# Patient Record
Sex: Female | Born: 2000
Health system: Southern US, Community
[De-identification: ages and names within clinical notes are randomized; demographics above are authoritative.]

## PROBLEM LIST (undated history)

## (undated) DIAGNOSIS — E079 Disorder of thyroid, unspecified: Secondary | ICD-10-CM

## (undated) DIAGNOSIS — K219 Gastro-esophageal reflux disease without esophagitis: Secondary | ICD-10-CM

## (undated) HISTORY — DX: Disorder of thyroid, unspecified: E07.9

## (undated) HISTORY — DX: Gastro-esophageal reflux disease without esophagitis: K21.9

## (undated) HISTORY — PX: TONSILLECTOMY: SUR1361

---
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
## 2018-02-25 ENCOUNTER — Other Ambulatory Visit: Payer: Self-pay | Admitting: Unknown Physician Specialty

## 2018-02-25 DIAGNOSIS — H7492 Unspecified disorder of left middle ear and mastoid: Secondary | ICD-10-CM

## 2018-03-05 ENCOUNTER — Ambulatory Visit
Admission: RE | Admit: 2018-03-05 | Discharge: 2018-03-05 | Disposition: A | Discharge status: Home / Self Care | Payer: Managed Care, Other (non HMO) | Source: Ambulatory Visit | Attending: Unknown Physician Specialty | Admitting: Unknown Physician Specialty

## 2018-03-05 DIAGNOSIS — H7492 Unspecified disorder of left middle ear and mastoid: Secondary | ICD-10-CM | POA: Insufficient documentation

## 2018-11-11 ENCOUNTER — Other Ambulatory Visit: Payer: Self-pay | Admitting: Sports Medicine

## 2018-11-11 DIAGNOSIS — M79604 Pain in right leg: Secondary | ICD-10-CM

## 2018-11-29 ENCOUNTER — Ambulatory Visit
Admission: RE | Admit: 2018-11-29 | Discharge: 2018-11-29 | Disposition: A | Discharge status: Home / Self Care | Payer: Managed Care, Other (non HMO) | Source: Ambulatory Visit | Attending: Sports Medicine | Admitting: Sports Medicine

## 2018-11-29 ENCOUNTER — Other Ambulatory Visit: Payer: Self-pay

## 2018-11-29 DIAGNOSIS — M79604 Pain in right leg: Secondary | ICD-10-CM | POA: Insufficient documentation

## 2019-05-19 ENCOUNTER — Other Ambulatory Visit: Payer: Self-pay | Admitting: Physician Assistant

## 2019-05-19 ENCOUNTER — Other Ambulatory Visit: Payer: Self-pay

## 2019-05-19 ENCOUNTER — Other Ambulatory Visit (HOSPITAL_COMMUNITY): Payer: Self-pay | Admitting: Physician Assistant

## 2019-05-19 ENCOUNTER — Ambulatory Visit
Admission: RE | Admit: 2019-05-19 | Discharge: 2019-05-19 | Disposition: A | Discharge status: Home / Self Care | Payer: Managed Care, Other (non HMO) | Source: Ambulatory Visit | Attending: Physician Assistant | Admitting: Physician Assistant

## 2019-05-19 ENCOUNTER — Encounter (INDEPENDENT_AMBULATORY_CARE_PROVIDER_SITE_OTHER): Payer: Self-pay

## 2019-05-19 DIAGNOSIS — R1031 Right lower quadrant pain: Secondary | ICD-10-CM | POA: Insufficient documentation

## 2019-05-19 MED ORDER — IOHEXOL 300 MG/ML  SOLN
100.0000 mL | Freq: Once | INTRAMUSCULAR | Status: AC | PRN
  Administered 2019-05-19: 100 mL via INTRAVENOUS

## 2019-05-22 ENCOUNTER — Other Ambulatory Visit: Payer: Self-pay

## 2019-05-22 ENCOUNTER — Other Ambulatory Visit: Payer: Self-pay | Admitting: Certified Nurse Midwife

## 2019-05-22 ENCOUNTER — Ambulatory Visit
Admission: RE | Admit: 2019-05-22 | Discharge: 2019-05-22 | Disposition: A | Discharge status: Home / Self Care | Payer: Managed Care, Other (non HMO) | Source: Ambulatory Visit | Attending: Certified Nurse Midwife | Admitting: Certified Nurse Midwife

## 2019-05-22 ENCOUNTER — Other Ambulatory Visit (HOSPITAL_COMMUNITY): Payer: Self-pay | Admitting: Certified Nurse Midwife

## 2019-05-22 DIAGNOSIS — R1031 Right lower quadrant pain: Secondary | ICD-10-CM

## 2020-07-08 ENCOUNTER — Ambulatory Visit: Payer: Self-pay

## 2020-07-23 ENCOUNTER — Encounter: Payer: Self-pay | Admitting: Emergency Medicine

## 2020-07-23 ENCOUNTER — Ambulatory Visit
Admission: EM | Admit: 2020-07-23 | Discharge: 2020-07-23 | Disposition: A | Discharge status: Home / Self Care | Payer: Managed Care, Other (non HMO) | Attending: Emergency Medicine | Admitting: Emergency Medicine

## 2020-07-23 ENCOUNTER — Other Ambulatory Visit: Payer: Self-pay

## 2020-07-23 DIAGNOSIS — R300 Dysuria: Secondary | ICD-10-CM | POA: Diagnosis present

## 2020-07-23 DIAGNOSIS — R350 Frequency of micturition: Secondary | ICD-10-CM | POA: Diagnosis present

## 2020-07-23 LAB — POCT URINALYSIS DIP (MANUAL ENTRY)
Bilirubin, UA: NEGATIVE
Blood, UA: NEGATIVE
Glucose, UA: NEGATIVE mg/dL
Ketones, POC UA: NEGATIVE mg/dL
Leukocytes, UA: NEGATIVE
Nitrite, UA: NEGATIVE
Protein Ur, POC: NEGATIVE mg/dL
Spec Grav, UA: 1.015 (ref 1.010–1.025)
Urobilinogen, UA: 1 E.U./dL
pH, UA: 7 (ref 5.0–8.0)

## 2020-07-23 LAB — POCT URINE PREGNANCY: Preg Test, Ur: NEGATIVE

## 2020-07-23 MED ORDER — PHENAZOPYRIDINE HCL 200 MG PO TABS: 200.0000 mg | ORAL_TABLET | Freq: Three times a day (TID) | ORAL | 0 refills | Status: DC

## 2020-07-23 NOTE — ED Provider Notes (Signed)
[facility]   CC: Burning with urination  SUBJECTIVE:  Charlene Hensley is a 20 y.o. female who complains of urinary frequency, and pain x 1 day.  Patient denies a precipitating event, recent sexual encounter, excessive caffeine intake.  Denies abdominal or flank pain.  Has NOT tried OTC medications.  Symptoms are made worse with urination.  Admits to similar symptoms in the past.  Denies fever, chills, nausea, vomiting, abdominal pain, flank pain, abnormal vaginal discharge or bleeding, hematuria.    LMP: No LMP recorded.  ROS: As in HPI.  All other pertinent ROS negative.     History reviewed. No pertinent past medical history. History reviewed. No pertinent surgical history. No Known Allergies No current facility-administered medications on file prior to encounter.   No current outpatient medications on file prior to encounter.   Social History   Socioeconomic History  . Marital status: Single    Spouse name: Not on file  . Number of children: Not on file  . Years of education: Not on file  . Highest education level: Not on file  Occupational History  . Not on file  Tobacco Use  . Smoking status: Never Smoker  . Smokeless tobacco: Never Used  Substance and Sexual Activity  . Alcohol use: Not on file  . Drug use: Not on file  . Sexual activity: Not on file  Other Topics Concern  . Not on file  Social History Narrative  . Not on file   Social Determinants of Health   Financial Resource Strain: Not on file  Food Insecurity: Not on file  Transportation Needs: Not on file  Physical Activity: Not on file  Stress: Not on file  Social Connections: Not on file  Intimate Partner Violence: Not on file   No family history on file.  OBJECTIVE:  Vitals:   07/23/20 1222  BP: 118/79  Pulse: 92  Resp: 16  Temp: 98.2 F (36.8 C)  TempSrc: Oral  SpO2: 97%   General appearance: AOx3 in no acute distress HEENT: NCAT.  Oropharynx clear.  Lungs: clear to  auscultation bilaterally without adventitious breath sounds Heart: regular rate and rhythm.   Abdomen: soft; non-distended; no tenderness; bowel sounds present; no guarding Back: no CVA tenderness Extremities: no edema; symmetrical with no gross deformities Skin: warm and dry Neurologic: Ambulates from chair to exam table without difficulty Psychological: alert and cooperative; normal mood and affect  Labs Reviewed  URINE CULTURE  POCT URINALYSIS DIP (MANUAL ENTRY)  POCT URINE PREGNANCY    ASSESSMENT & PLAN:  1. Urinary frequency   2. Dysuria     Meds ordered this encounter  Medications  . phenazopyridine (PYRIDIUM) 200 MG tablet    Sig: Take 1 tablet (200 mg total) by mouth 3 (three) times daily.    Dispense:  6 tablet    Refill:  0    Order Specific Question:   Supervising Provider    Answer:   Eustace Moore [[id number]]    Urine culture sent.  We will call you with abnormal results.   Push fluids and get plenty of rest.   Take pyridium as prescribed and as needed for symptomatic relief Follow up with PCP if symptoms persists Return here or go to ER if you have any new or worsening symptoms such as fever, worsening abdominal pain, nausea/vomiting, flank pain, etc...  Outlined signs and symptoms indicating need for more acute intervention. Patient verbalized understanding. After Visit Summary given.     Ludella Pranger,  [place], PA-C 07/23/20 1315

## 2020-07-23 NOTE — ED Triage Notes (Signed)
urinary frequency and pain since yesterday

## 2020-07-23 NOTE — Discharge Instructions (Signed)
Urine culture sent.  We will call you with abnormal results.   Push fluids and get plenty of rest.   Take pyridium as prescribed and as needed for symptomatic relief Follow up with PCP if symptoms persists Return here or go to ER if you have any new or worsening symptoms such as fever, worsening abdominal pain, nausea/vomiting, flank pain, etc..Marland Kitchen

## 2020-07-25 LAB — URINE CULTURE
Culture: >=100000 — AB
Special Requests: NORMAL

## 2020-11-30 ENCOUNTER — Other Ambulatory Visit: Payer: Self-pay

## 2020-11-30 ENCOUNTER — Ambulatory Visit
Admission: EM | Admit: 2020-11-30 | Discharge: 2020-11-30 | Disposition: A | Discharge status: Home / Self Care | Payer: Managed Care, Other (non HMO) | Attending: Emergency Medicine | Admitting: Emergency Medicine

## 2020-11-30 ENCOUNTER — Encounter: Payer: Self-pay | Admitting: *Deleted

## 2020-11-30 DIAGNOSIS — Z23 Encounter for immunization: Secondary | ICD-10-CM

## 2020-11-30 MED ORDER — TETANUS-DIPHTH-ACELL PERTUSSIS 5-2.5-18.5 LF-MCG/0.5 IM SUSY
0.5000 mL | PREFILLED_SYRINGE | Freq: Once | INTRAMUSCULAR | Status: AC
  Administered 2020-11-30: 0.5 mL via INTRAMUSCULAR

## 2020-11-30 NOTE — ED Triage Notes (Signed)
Pt needs Tdap for school

## 2021-02-01 ENCOUNTER — Encounter: Payer: Self-pay | Admitting: Emergency Medicine

## 2021-02-01 ENCOUNTER — Other Ambulatory Visit: Payer: Self-pay

## 2021-02-01 ENCOUNTER — Ambulatory Visit
Admission: EM | Admit: 2021-02-01 | Discharge: 2021-02-01 | Disposition: A | Discharge status: Home / Self Care | Payer: Managed Care, Other (non HMO) | Attending: Emergency Medicine | Admitting: Emergency Medicine

## 2021-02-01 DIAGNOSIS — H9203 Otalgia, bilateral: Secondary | ICD-10-CM

## 2021-02-01 DIAGNOSIS — M26623 Arthralgia of bilateral temporomandibular joint: Secondary | ICD-10-CM | POA: Diagnosis not present

## 2021-02-01 MED ORDER — CYCLOBENZAPRINE HCL 10 MG PO TABS: 10.0000 mg | ORAL_TABLET | Freq: Every day | ORAL | 0 refills | Status: DC

## 2021-02-01 MED ORDER — PREDNISONE 20 MG PO TABS: 40.0000 mg | ORAL_TABLET | Freq: Every day | ORAL | 0 refills | Status: AC

## 2021-02-01 MED ORDER — NAPROXEN 500 MG PO TABS: 500.0000 mg | ORAL_TABLET | Freq: Two times a day (BID) | ORAL | 0 refills | Status: DC

## 2021-02-01 NOTE — ED Triage Notes (Signed)
Reoccurring bilateral ear infection has been using cipro drops.  States ears itch and burn and inside canal has been swelling.  Ears are tender on the outside.  States symptoms x a few months.  Has seen and ENT without any answers.

## 2021-02-01 NOTE — ED Provider Notes (Signed)
HPI  SUBJECTIVE:  Charlene Hensley is a 20 y.o. female who presents with bilateral ear itching, burning pain.  She is concerned that she has "another ear infection."  This will be the fourth 1 this month.  She has had ear pain every week over the past month, it has never completely resolved.  No change in her hearing, otorrhea, denies foreign body insertion, recent swelling, fevers, vertigo, nasal congestion, rhinorrhea, URI or allergy symptoms.  She saw ENT in July 22, was found to have otitis externa and was sent home with Ciprodex.  She has been on the Ciprodex for the past week with worsening of her pain.  She was evaluated at another urgent care 1 week ago and just finished a course of amoxicillin without any improvement.  She denies grinding her teeth at night.  She has a past medical history of hypothyroidism.  No history of diabetes.  LMP: Last week.  Denies the possibility of being pregnant.  JYN:WGNFAO, Hardin Negus, MD ENT: Dr. Jenne Campus    History reviewed. No pertinent past medical history.  History reviewed. No pertinent surgical history.  History reviewed. No pertinent family history.  Social History   Tobacco Use   Smoking status: Never   Smokeless tobacco: Never    No current facility-administered medications for this encounter.  Current Outpatient Medications:    cyclobenzaprine (FLEXERIL) 10 MG tablet, Take 1 tablet (10 mg total) by mouth at bedtime., Disp: 20 tablet, Rfl: 0   naproxen (NAPROSYN) 500 MG tablet, Take 1 tablet (500 mg total) by mouth 2 (two) times daily., Disp: 20 tablet, Rfl: 0   predniSONE (DELTASONE) 20 MG tablet, Take 2 tablets (40 mg total) by mouth daily with breakfast for 5 days., Disp: 10 tablet, Rfl: 0  No Known Allergies   ROS  As noted in HPI.   Physical Exam  BP 122/88 (BP Location: Right Arm)   Pulse 87   Temp 98.2 F (36.8 C) (Oral)   Resp 18   LMP 01/27/2021 (Exact Date)   SpO2 98%   Constitutional: Well developed, well nourished, no  acute distress Eyes:  EOMI, conjunctiva normal bilaterally HENT: Normocephalic, atraumatic,mucus membranes moist.  Bilateral external ears, EACs, TMs normal.  Hearing grossly intact and equal bilaterally.  Bilateral pain with traction on pinna, palpation of tragus.  Mastoid nontender, normal appearance bilaterally.  Bilateral TMJ tenderness.  No crepitus. Neck: No cervical adenopathy Respiratory: Normal inspiratory effort Cardiovascular: Normal rate GI: nondistended skin: No rash, skin intact Musculoskeletal: no deformities Neurologic: Alert & oriented x 3, no focal neuro deficits Psychiatric: Speech and behavior appropriate   ED Course   Medications - No data to display  No orders of the defined types were placed in this encounter.   No results found for this or any previous visit (from the past 24 hour(s)). No results found.  ED Clinical Impression  1. Bilateral temporomandibular joint pain   2. Otalgia of both ears      ED Assessment/Plan  Suspect TMJ arthralgia causing the ear pain.  Ciprodex is making her symptoms worse.  There is no evidence of an otitis externa or media.  Will send home with Naprosyn, prednisone 40 mg for 5 days, Flexeril at night, soft diet, discontinue Ciprodex.  Follow-up with Dr. Jenne Campus, her ENT, or her dentist if this does not help.  Discussed MDM, treatment plan, and plan for follow-up with patient. patient agrees with plan.   Meds ordered this encounter  Medications   naproxen (NAPROSYN)  500 MG tablet    Sig: Take 1 tablet (500 mg total) by mouth 2 (two) times daily.    Dispense:  20 tablet    Refill:  0   cyclobenzaprine (FLEXERIL) 10 MG tablet    Sig: Take 1 tablet (10 mg total) by mouth at bedtime.    Dispense:  20 tablet    Refill:  0   predniSONE (DELTASONE) 20 MG tablet    Sig: Take 2 tablets (40 mg total) by mouth daily with breakfast for 5 days.    Dispense:  10 tablet    Refill:  0      *This clinic note was created using  Scientist, clinical (histocompatibility and immunogenetics). Therefore, there may be occasional mistakes despite careful proofreading.  ?    Domenick Gong, MD 02/02/21 [phone]

## 2021-02-01 NOTE — Discharge Instructions (Addendum)
Soft diet, Flexeril at night to help prevent you from grinding your teeth.  Try Naprosyn with 1000 mg of Tylenol twice a day.  Prednisone will also help with inflammation.  You can follow-up with your dentist or with Dr. Jenne Campus if this does not help.

## 2021-03-29 IMAGING — CT CT ABD-PELV W/ CM
1 of 2 series · 15 of 32 positions shown, 19 images · IV contrast (APPLIED)
Comparison: None.

CLINICAL DATA: Acute right lower quadrant abdominal pain.

EXAM:
CT ABDOMEN AND PELVIS WITH CONTRAST
TECHNIQUE: Multidetector CT imaging of the abdomen and pelvis was performed
using the standard protocol following bolus administration of
intravenous contrast.
CONTRAST:  100mL OMNIPAQUE IOHEXOL 300 MG/ML  SOLN

[Series 2: axial st · axial · 0.66mm/px · z∈[-1136,-726]mm · 15 of 90 slices shown, 19 images]
[im 4/90  soft-tissue]
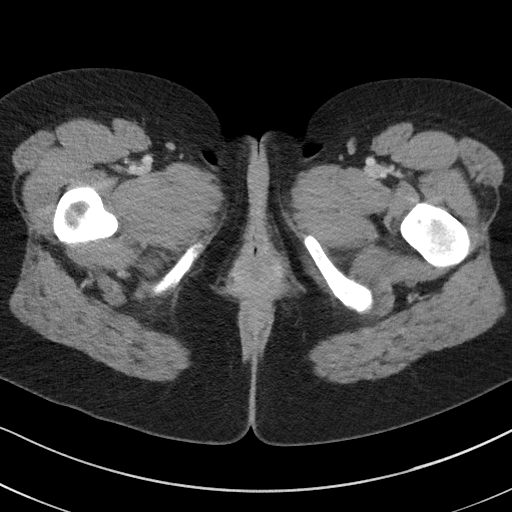
[im 4/90  bone]
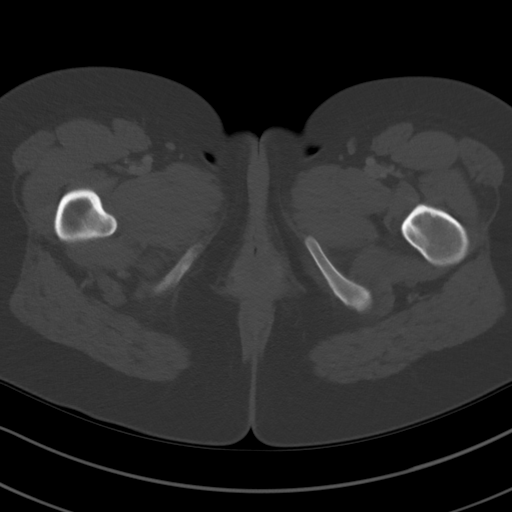
[im 11/90  soft-tissue]
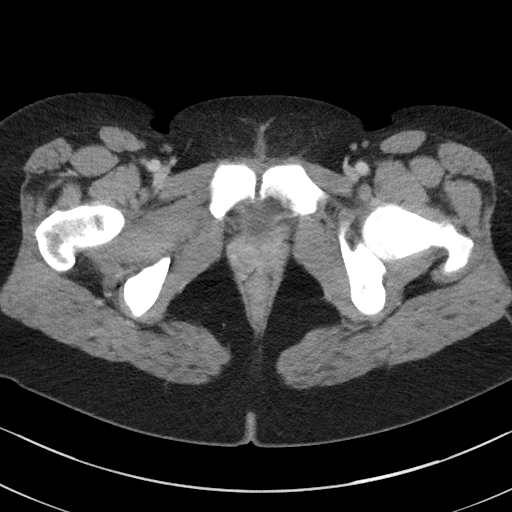
[im 18/90  soft-tissue]
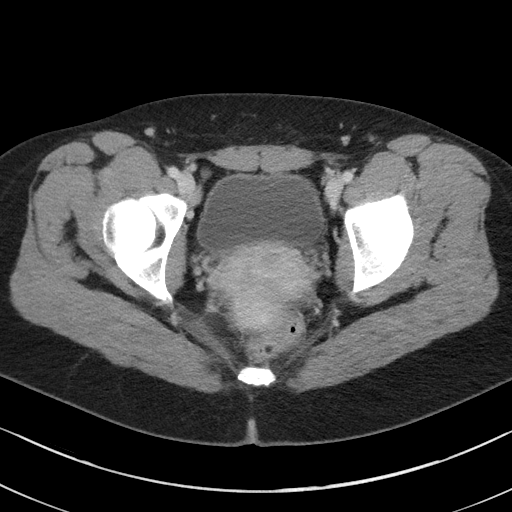
[im 25/90  soft-tissue]
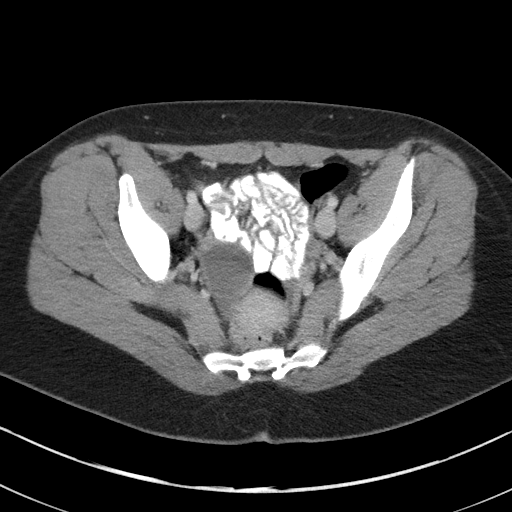
[im 33/90  soft-tissue]
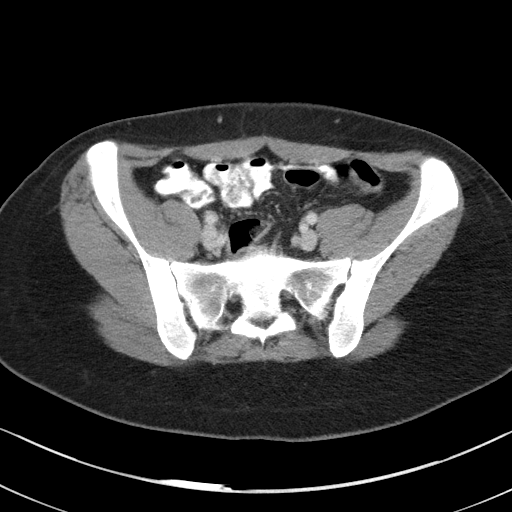
[im 40/90  soft-tissue]
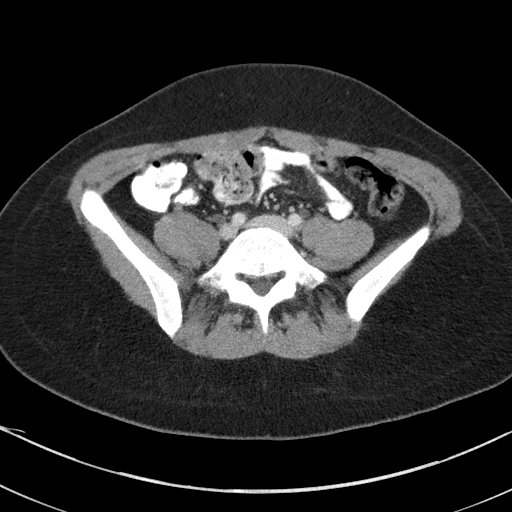
[im 47/90  soft-tissue]
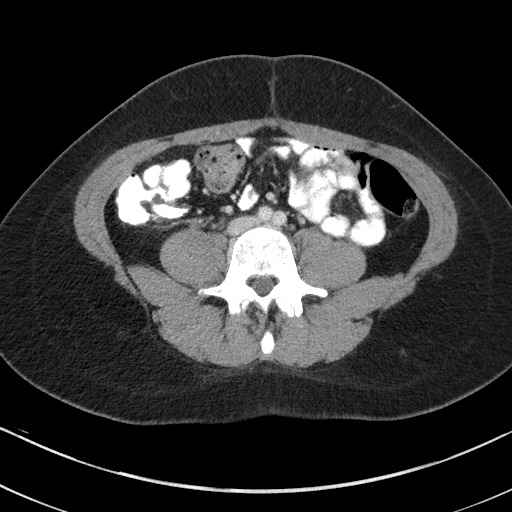
[im 50/90  soft-tissue]
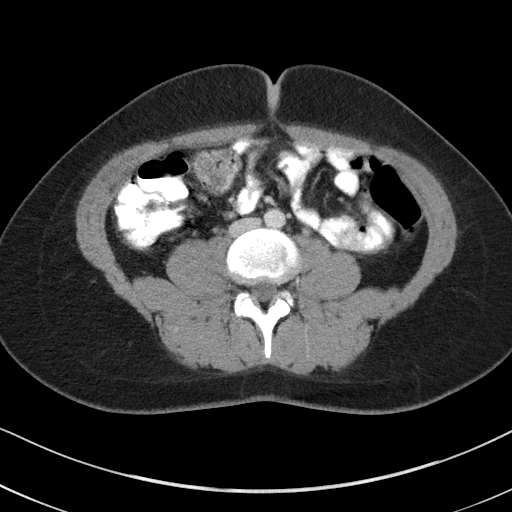
[im 57/90  soft-tissue]
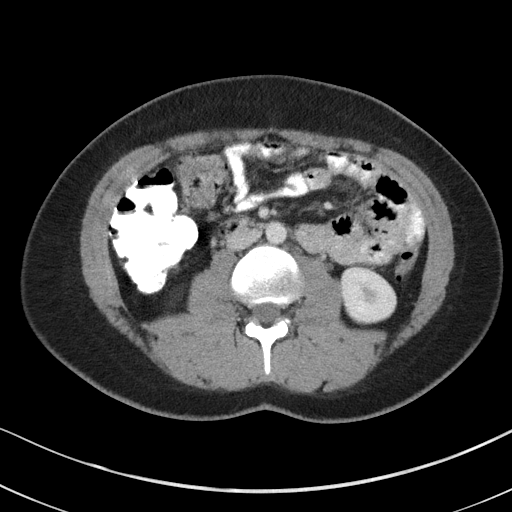
[im 57/90  bone]
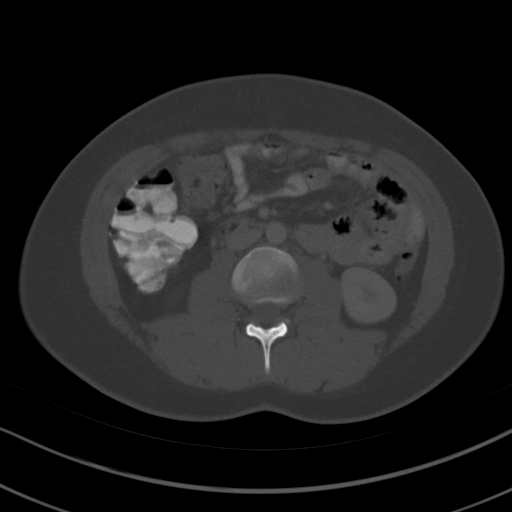
[im 65/90  soft-tissue]
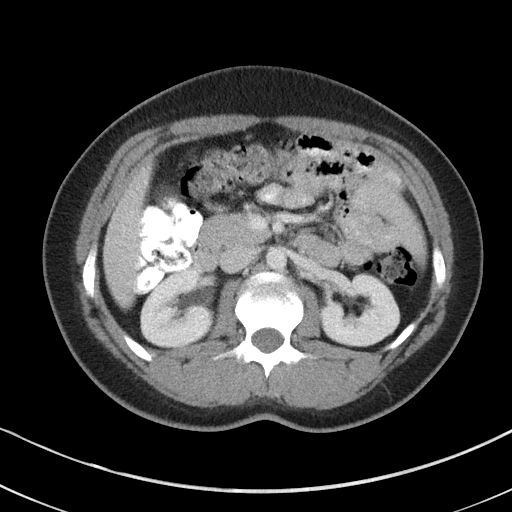
[im 72/90  soft-tissue]
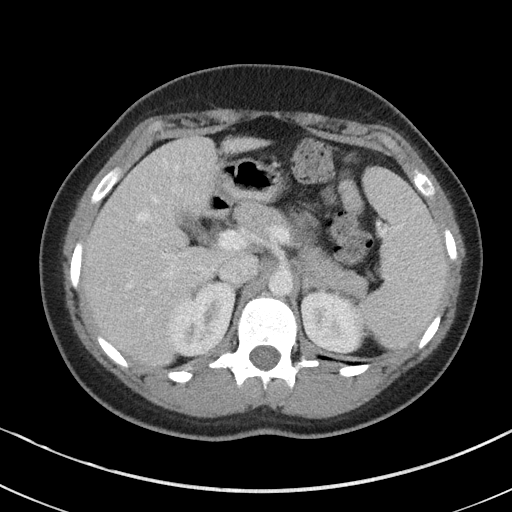
[im 75/90  lung]
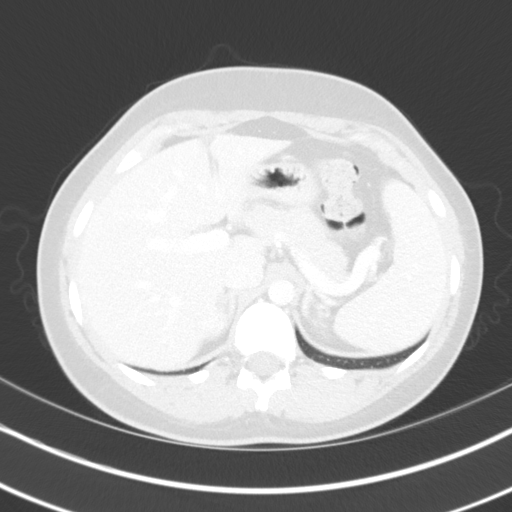
[im 79/90  soft-tissue]
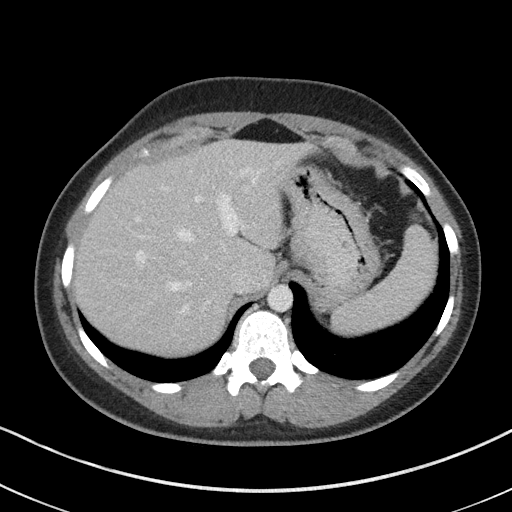
[im 79/90  lung]
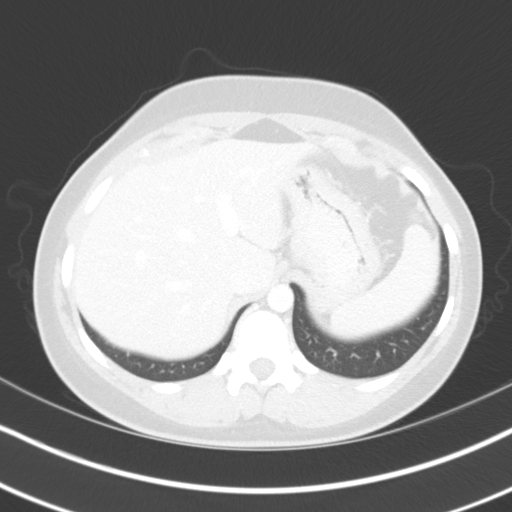
[im 82/90  lung]
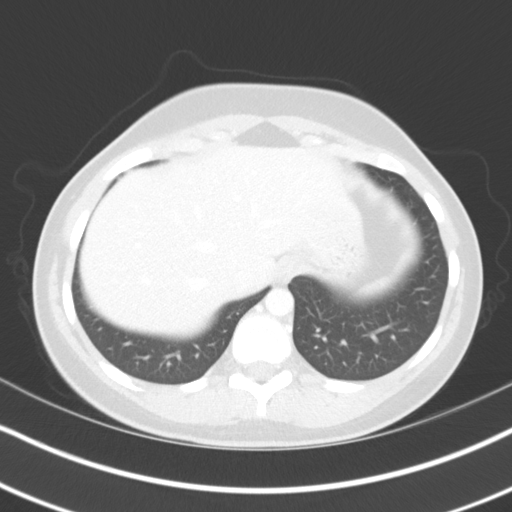
[im 86/90  soft-tissue]
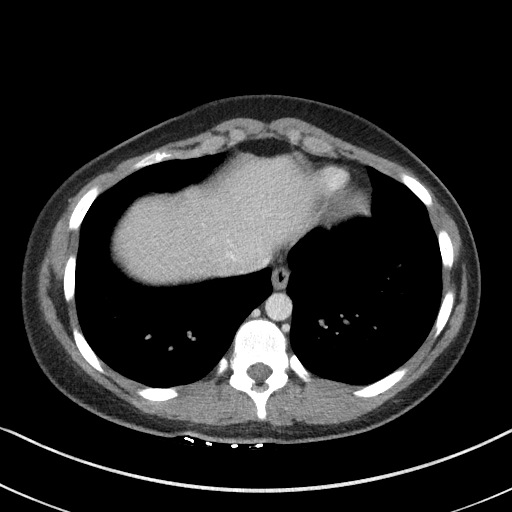
[im 86/90  lung]
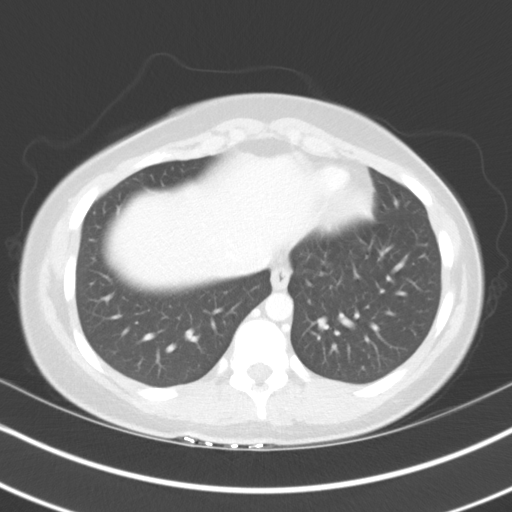

[15 of 32 positions shown; findings below may reference images not displayed]

FINDINGS: Lower chest: No acute abnormality.

Hepatobiliary: No focal liver abnormality is seen. No gallstones,
gallbladder wall thickening, or biliary dilatation.

Pancreas: Unremarkable. No pancreatic ductal dilatation or
surrounding inflammatory changes.

Spleen: Normal in size without focal abnormality.

Adrenals/Urinary Tract: Adrenal glands are unremarkable. Kidneys are
normal, without renal calculi, focal lesion, or hydronephrosis.
Bladder is unremarkable.

Stomach/Bowel: Stomach is within normal limits. Appendix appears
normal. No evidence of bowel wall thickening, distention, or
inflammatory changes.

Vascular/Lymphatic: No significant vascular findings are present. No
enlarged abdominal or pelvic lymph nodes.

Reproductive: 3.5 cm right ovarian cyst is noted. Uterus and left
adnexa are unremarkable.

Other: No abdominal wall hernia or abnormality. No abdominopelvic
ascites.

Musculoskeletal: No acute or significant osseous findings.
IMPRESSION: 3.5 cm right ovarian cyst. No other abnormality seen in the abdomen
or pelvis.

## 2021-10-25 ENCOUNTER — Other Ambulatory Visit (HOSPITAL_COMMUNITY)
Admission: RE | Admit: 2021-10-25 | Discharge: 2021-10-25 | Disposition: A | Discharge status: Home / Self Care | Payer: Managed Care, Other (non HMO) | Source: Ambulatory Visit | Attending: Adult Health | Admitting: Adult Health

## 2021-10-25 ENCOUNTER — Encounter: Payer: Self-pay | Admitting: Adult Health

## 2021-10-25 ENCOUNTER — Ambulatory Visit (INDEPENDENT_AMBULATORY_CARE_PROVIDER_SITE_OTHER): Payer: Managed Care, Other (non HMO) | Admitting: Adult Health

## 2021-10-25 VITALS — BP 123/77 | HR 86 | Ht 65.5 in | Wt 181.0 lb

## 2021-10-25 DIAGNOSIS — Z01419 Encounter for gynecological examination (general) (routine) without abnormal findings: Secondary | ICD-10-CM | POA: Diagnosis present

## 2021-10-25 DIAGNOSIS — Z30011 Encounter for initial prescription of contraceptive pills: Secondary | ICD-10-CM | POA: Insufficient documentation

## 2021-10-25 MED ORDER — NORETHIN ACE-ETH ESTRAD-FE 1-20 MG-MCG PO TABS: 1.0000 | ORAL_TABLET | Freq: Every day | ORAL | 3 refills | Status: DC

## 2021-10-25 NOTE — Progress Notes (Signed)
Patient ID: Charlene Hensley, female   DOB: 2000-06-10, 21 y.o.   MRN: [id number] History of Present Illness: Charlene Hensley is a 21 year old white female, engaged, G0P0, in for a well woman gyn exam and first pap. She is a new pt. She works at [employer]. PCP is Dr Hyacinth Meeker, at [facility].   Current Medications, Allergies, Past Medical History, Past Surgical History, Family History and Social History were reviewed in [name] record.     Review of Systems: Patient denies any headaches, hearing loss, fatigue, blurred vision, shortness of breath, chest pain, abdominal pain, problems with bowel movements, urination, or intercourse.(Has never had sex). No joint pain or Hensley swings.  Stress at work   Physical Exam:BP 123/77 (BP Location: Left Arm, Patient Position: Sitting, Cuff Size: Normal)   Pulse 86   Ht 5' 5.5" (1.664 m)   Wt 181 lb (82.1 kg)   LMP 10/23/2021   BMI 29.66 kg/m   General:  Well developed, well nourished, no acute distress Skin:  Warm and dry Neck:  Midline trachea, normal thyroid, good ROM, no lymphadenopathy Lungs; Clear to auscultation bilaterally Breast:  No dominant palpable mass, retraction, or nipple discharge Cardiovascular: Regular rate and rhythm Abdomen:  Soft, non tender, no hepatosplenomegaly Pelvic:  External genitalia is normal in appearance, no lesions.  The vagina is normal in appearance. +period blood.Urethra has no lesions or masses. The cervix is nulliparous,pap with GC.CHL performed.  Uterus is felt to be normal size, shape, and contour.  No adnexal masses or tenderness noted.Bladder is non tender, no masses felt. Extremities/musculoskeletal:  No swelling or varicosities noted, no clubbing or cyanosis Psych:  No Hensley changes, alert and cooperative,seems happy AA is 2 Fall risk is low    10/25/2021   10:49 AM  Depression screen PHQ 2/9  Decreased Interest 0  Down, Depressed, Hopeless 2  PHQ - 2 Score 2  Altered sleeping 3  Tired,  decreased energy 3  Change in appetite 0  Feeling bad or failure about yourself  2  Trouble concentrating 3  Moving slowly or fidgety/restless 0  Suicidal thoughts 0  PHQ-9 Score 13       10/25/2021   10:49 AM  GAD 7 : Generalized Anxiety Score  Nervous, Anxious, on Edge 2  Control/stop worrying 3  Worry too much - different things 1  Trouble relaxing 3  Restless 0  Easily annoyed or irritable 0  Afraid - awful might happen 1  Total GAD 7 Score 10      Upstream - 10/25/21 1100       Pregnancy Intention Screening   Does the patient want to become pregnant in the next year? No    Does the patient's partner want to become pregnant in the next year? No    Would the patient like to discuss contraceptive options today? Yes      Contraception Wrap Up   Current Method Abstinence    End Method Oral Contraceptive;Abstinence    Contraception Counseling Provided Yes             Examination chaperoned by Charlene Mood LPN  Impression and Plan: 1. Encounter for gynecological examination with Papanicolaou smear of cervix Pap sent Physical in 1 year Pap in 3 years if normal  2. Encounter for initial prescription of contraceptive pills She denies any MI,strike, DVT, breat cancer or migraine with aura Will start [place] 1/20 today, take same time every day Meds ordered this encounter  Medications  norethindrone-ethinyl estradiol-FE (LOESTRIN FE) 1-20 MG-MCG tablet    Sig: Take 1 tablet by mouth daily.    Dispense:  84 tablet    Refill:  3    Order Specific Question:   Supervising Provider    Answer:   Duane Lope H [[id number]]    Follow up in 3 months for ROS

## 2021-10-26 LAB — CYTOLOGY - PAP
Chlamydia: NEGATIVE
Comment: NEGATIVE
Comment: NORMAL
Diagnosis: NEGATIVE
Neisseria Gonorrhea: NEGATIVE

## 2021-11-24 ENCOUNTER — Encounter: Payer: Self-pay | Admitting: Nurse Practitioner

## 2021-11-24 ENCOUNTER — Ambulatory Visit: Payer: Managed Care, Other (non HMO) | Admitting: Nurse Practitioner

## 2021-11-24 VITALS — BP 110/76 | HR 104 | Ht 67.0 in | Wt 180.4 lb

## 2021-11-24 DIAGNOSIS — G43009 Migraine without aura, not intractable, without status migrainosus: Secondary | ICD-10-CM | POA: Diagnosis not present

## 2021-11-24 DIAGNOSIS — E039 Hypothyroidism, unspecified: Secondary | ICD-10-CM

## 2021-11-24 MED ORDER — SUMATRIPTAN SUCCINATE 50 MG PO TABS: 50.0000 mg | ORAL_TABLET | ORAL | 0 refills | Status: AC | PRN

## 2021-11-24 NOTE — Progress Notes (Signed)
   Subjective:    Patient ID: Charlene Hensley, female    DOB: 04/28/2000, 21 y.o.   MRN: [id number]  HPI  21 year old patient with history of thyroid disease and headaches presents to the clinic to establish care.  Patient states that she was started on Propanolol for frequent headaches which have helped. However patient states that she continues to have headaches 1-2 week. Patient was previously on Topamax, however patient states that that medication was not helpful.   Patient getting married In the fall.   Review of Systems  Neurological:  Positive for headaches.  All other systems reviewed and are negative.      Objective:   Physical Exam Vitals reviewed.  Constitutional:      General: She is not in acute distress.    Appearance: Normal appearance. She is normal weight. She is not ill-appearing, toxic-appearing or diaphoretic.  HENT:     Head: Normocephalic and atraumatic.  Cardiovascular:     Rate and Rhythm: Normal rate and regular rhythm.     Pulses: Normal pulses.     Heart sounds: Normal heart sounds. No murmur heard. Pulmonary:     Effort: Pulmonary effort is normal. No respiratory distress.     Breath sounds: Normal breath sounds. No wheezing.  Musculoskeletal:     Comments: Grossly intact  Skin:    General: Skin is warm.     Capillary Refill: Capillary refill takes less than 2 seconds.  Neurological:     General: No focal deficit present.     Mental Status: She is alert.     Cranial Nerves: No cranial nerve deficit.     Sensory: No sensory deficit.     Motor: No weakness.     Coordination: Coordination normal.     Gait: Gait normal.     Comments: Grossly intact  Psychiatric:        Mood and Affect: Mood normal.        Behavior: Behavior normal.           Assessment & Plan:   1. Hypothyroidism, unspecified type -Patient currently taking levothyroxine - TSH + free T4  2. Migraine without aura and without status migrainosus, not intractable -  Continue taking propanolol  - Will add abortive therapy with triptan. - S/E reviewed in detail.  - SUMAtriptan (IMITREX) 50 MG tablet; Take 1 tablet (50 mg total) by mouth every 2 (two) hours as needed for migraine. May repeat in 2 hours if headache persists or recurs. Do not exceed 200mg  per day.  Dispense: 10 tablet; Refill: 0 - RTC in 3 months or sooner if needed.

## 2021-11-25 LAB — TSH+FREE T4
Free T4: 1.7 ng/dL (ref 0.82–1.77)
TSH: 1.28 u[IU]/mL (ref 0.450–4.500)

## 2021-12-19 ENCOUNTER — Encounter: Payer: Self-pay | Admitting: Nurse Practitioner

## 2021-12-22 ENCOUNTER — Ambulatory Visit: Payer: Managed Care, Other (non HMO) | Admitting: Nurse Practitioner

## 2021-12-29 ENCOUNTER — Encounter: Payer: Self-pay | Admitting: Nurse Practitioner

## 2021-12-29 ENCOUNTER — Ambulatory Visit: Payer: Managed Care, Other (non HMO) | Admitting: Nurse Practitioner

## 2021-12-29 VITALS — BP 98/62 | HR 79 | Temp 98.2°F | Wt 184.0 lb

## 2021-12-29 DIAGNOSIS — F32A Depression, unspecified: Secondary | ICD-10-CM | POA: Diagnosis not present

## 2021-12-29 DIAGNOSIS — E039 Hypothyroidism, unspecified: Secondary | ICD-10-CM

## 2021-12-29 DIAGNOSIS — G2581 Restless legs syndrome: Secondary | ICD-10-CM | POA: Diagnosis not present
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
## 2021-12-29 NOTE — Patient Instructions (Addendum)
[facility]) Phone: [phone] Fax: [phone]  [facility])  Phone: [phone] Fax:  [phone]  [facility] (telehealth only for now) [email or web address] [phone]  [facility] [phone]  [facility] [phone]  [facility] [address], [place], [place] [address]  ~4.7 mi [phone]  [place] [phone]  [facility] [place] [phone]

## 2021-12-30 LAB — IRON,TIBC AND FERRITIN PANEL
Ferritin: 50 ng/mL (ref 15–150)
Iron Saturation: 36 % (ref 15–55)
Iron: 102 ug/dL (ref 27–159)
Total Iron Binding Capacity: 282 ug/dL (ref 250–450)
UIBC: 180 ug/dL (ref 131–425)

## 2021-12-30 LAB — CBC WITH DIFFERENTIAL/PLATELET
Basophils Absolute: 0.1 10*3/uL (ref 0.0–0.2)
Basos: 1 %
EOS (ABSOLUTE): 0.4 10*3/uL (ref 0.0–0.4)
Eos: 5 %
Hematocrit: 40 % (ref 34.0–46.6)
Hemoglobin: 13.8 g/dL (ref 11.1–15.9)
Immature Grans (Abs): 0 10*3/uL (ref 0.0–0.1)
Immature Granulocytes: 0 %
Lymphocytes Absolute: 1.8 10*3/uL (ref 0.7–3.1)
Lymphs: 22 %
MCH: 30.6 pg (ref 26.6–33.0)
MCHC: 34.5 g/dL (ref 31.5–35.7)
MCV: 89 fL (ref 79–97)
Monocytes Absolute: 0.6 10*3/uL (ref 0.1–0.9)
Monocytes: 7 %
Neutrophils Absolute: 5.4 10*3/uL (ref 1.4–7.0)
Neutrophils: 65 %
Platelets: 306 10*3/uL (ref 150–450)
RBC: 4.51 x10E6/uL (ref 3.77–5.28)
RDW: 11.6 % — ABNORMAL LOW (ref 11.7–15.4)
WBC: 8.3 10*3/uL (ref 3.4–10.8)

## 2021-12-30 LAB — CMP14+EGFR
ALT: 14 IU/L (ref 0–32)
AST: 11 IU/L (ref 0–40)
Albumin/Globulin Ratio: 2 (ref 1.2–2.2)
Albumin: 4.1 g/dL (ref 4.0–5.0)
Alkaline Phosphatase: 57 IU/L (ref 44–121)
BUN/Creatinine Ratio: 7 — ABNORMAL LOW (ref 9–23)
BUN: 5 mg/dL — ABNORMAL LOW (ref 6–20)
Bilirubin Total: 0.4 mg/dL (ref 0.0–1.2)
CO2: 22 mmol/L (ref 20–29)
Calcium: 9.2 mg/dL (ref 8.7–10.2)
Chloride: 102 mmol/L (ref 96–106)
Creatinine, Ser: 0.7 mg/dL (ref 0.57–1.00)
Globulin, Total: 2.1 g/dL (ref 1.5–4.5)
Glucose: 86 mg/dL (ref 70–99)
Potassium: 4.5 mmol/L (ref 3.5–5.2)
Sodium: 137 mmol/L (ref 134–144)
Total Protein: 6.2 g/dL (ref 6.0–8.5)
eGFR: 125 mL/min/{1.73_m2} (ref >59)

## 2022-01-03 ENCOUNTER — Encounter: Payer: Self-pay | Admitting: Nurse Practitioner

## 2022-01-04 ENCOUNTER — Telehealth: Payer: Self-pay | Admitting: Nurse Practitioner

## 2022-01-04 NOTE — Telephone Encounter (Signed)
Pt calling in to get results of recent labs. Pt states she doesn't understand them and they were low. Please advise. Thank you.

## 2022-01-05 ENCOUNTER — Other Ambulatory Visit: Payer: Self-pay | Admitting: Nurse Practitioner

## 2022-01-05 DIAGNOSIS — G2581 Restless legs syndrome: Secondary | ICD-10-CM

## 2022-01-05 MED ORDER — GABAPENTIN 100 MG PO CAPS: 100.0000 mg | ORAL_CAPSULE | Freq: Two times a day (BID) | ORAL | 0 refills | Status: DC

## 2022-01-10 ENCOUNTER — Encounter: Payer: Self-pay | Admitting: Nurse Practitioner

## 2022-01-25 ENCOUNTER — Encounter: Payer: Self-pay | Admitting: Adult Health

## 2022-01-25 ENCOUNTER — Ambulatory Visit (INDEPENDENT_AMBULATORY_CARE_PROVIDER_SITE_OTHER): Payer: Managed Care, Other (non HMO) | Admitting: Adult Health

## 2022-01-25 VITALS — BP 112/77 | HR 94 | Ht 66.0 in | Wt 176.5 lb

## 2022-01-25 DIAGNOSIS — Z3041 Encounter for surveillance of contraceptive pills: Secondary | ICD-10-CM

## 2022-01-25 NOTE — Progress Notes (Signed)
  Subjective:     Patient ID: Charlene Hensley, female   DOB: 07/03/2000, 21 y.o.   MRN: [id number]  HPI Charlene Hensley is a 21 year old white female with SO, G0P0, on starting [place] 1/20 and is good, she likes the pill.  Last pap was 10/25/21 and negative   PCP is L [facility] NP  Review of Systems Periods good   Reviewed past medical,surgical, social and family history. Reviewed medications and allergies.  Objective:   Physical Exam BP 112/77 (BP Location: Left Arm, Patient Position: Sitting, Cuff Size: Normal)   Pulse 94   Ht 5\' 6"  (1.676 m)   Wt 176 lb 8 oz (80.1 kg)   LMP 01/03/2022   BMI 28.49 kg/m     Skin warm and dry.  Lungs: clear to ausculation bilaterally. Cardiovascular: regular rate and rhythm.  Fall risk is low  Upstream - 01/25/22 0907       Pregnancy Intention Screening   Does the patient want to become pregnant in the next year? No    Does the patient's partner want to become pregnant in the next year? No    Would the patient like to discuss contraceptive options today? No      Contraception Wrap Up   Current Method Oral Contraceptive;Abstinence    End Method Abstinence;Oral Contraceptive             Assessment:       1. Encounter for surveillance of contraceptive pills Will continue Loestrin 1/20, has refills     Plan:     Return in July 2024 for Physical   HPV vaccine is advised

## 2022-01-26 ENCOUNTER — Ambulatory Visit: Payer: Managed Care, Other (non HMO) | Admitting: Nurse Practitioner

## 2022-01-26 ENCOUNTER — Encounter: Payer: Self-pay | Admitting: Nurse Practitioner

## 2022-01-26 VITALS — BP 111/76 | Ht 66.0 in | Wt 177.0 lb

## 2022-01-26 DIAGNOSIS — E039 Hypothyroidism, unspecified: Secondary | ICD-10-CM

## 2022-01-26 DIAGNOSIS — F32A Depression, unspecified: Secondary | ICD-10-CM | POA: Diagnosis not present

## 2022-01-26 DIAGNOSIS — G2581 Restless legs syndrome: Secondary | ICD-10-CM

## 2022-01-26 MED ORDER — LEVOTHYROXINE SODIUM 100 MCG PO TABS: 100.0000 ug | ORAL_TABLET | Freq: Every day | ORAL | 1 refills | Status: AC
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
## 2022-01-30 ENCOUNTER — Encounter: Payer: Self-pay | Admitting: Nurse Practitioner

## 2022-01-31 ENCOUNTER — Emergency Department
Admission: EM | Admit: 2022-01-31 | Discharge: 2022-01-31 | Disposition: A | Discharge status: Home / Self Care | Payer: Managed Care, Other (non HMO) | Attending: Emergency Medicine | Admitting: Emergency Medicine

## 2022-01-31 ENCOUNTER — Other Ambulatory Visit: Payer: Self-pay

## 2022-01-31 ENCOUNTER — Encounter: Payer: Self-pay | Admitting: Emergency Medicine

## 2022-01-31 DIAGNOSIS — F4323 Adjustment disorder with mixed anxiety and depressed mood: Secondary | ICD-10-CM | POA: Diagnosis not present

## 2022-01-31 DIAGNOSIS — F332 Major depressive disorder, recurrent severe without psychotic features: Secondary | ICD-10-CM | POA: Insufficient documentation

## 2022-01-31 DIAGNOSIS — F411 Generalized anxiety disorder: Secondary | ICD-10-CM | POA: Insufficient documentation

## 2022-01-31 DIAGNOSIS — F410 Panic disorder [episodic paroxysmal anxiety] without agoraphobia: Secondary | ICD-10-CM | POA: Insufficient documentation

## 2022-01-31 DIAGNOSIS — F419 Anxiety disorder, unspecified: Secondary | ICD-10-CM

## 2022-01-31 DIAGNOSIS — F331 Major depressive disorder, recurrent, moderate: Secondary | ICD-10-CM | POA: Diagnosis present

## 2022-01-31 LAB — POC URINE PREG, ED: Preg Test, Ur: NEGATIVE

## 2022-01-31 LAB — CBC
HCT: 42.7 % (ref 36.0–46.0)
Hemoglobin: 14.4 g/dL (ref 12.0–15.0)
MCH: 30.6 pg (ref 26.0–34.0)
MCHC: 33.7 g/dL (ref 30.0–36.0)
MCV: 90.7 fL (ref 80.0–100.0)
Platelets: 308 10*3/uL (ref 150–400)
RBC: 4.71 MIL/uL (ref 3.87–5.11)
RDW: 11.4 % — ABNORMAL LOW (ref 11.5–15.5)
WBC: 8.5 10*3/uL (ref 4.0–10.5)
nRBC: 0 % (ref 0.0–0.2)

## 2022-01-31 LAB — URINE DRUG SCREEN, QUALITATIVE (ARMC ONLY)
Amphetamines, Ur Screen: NOT DETECTED
Barbiturates, Ur Screen: NOT DETECTED
Benzodiazepine, Ur Scrn: NOT DETECTED
Cannabinoid 50 Ng, Ur ~~LOC~~: NOT DETECTED
Cocaine Metabolite,Ur ~~LOC~~: NOT DETECTED
MDMA (Ecstasy)Ur Screen: NOT DETECTED
Methadone Scn, Ur: NOT DETECTED
Opiate, Ur Screen: NOT DETECTED
Phencyclidine (PCP) Ur S: NOT DETECTED
Tricyclic, Ur Screen: NOT DETECTED

## 2022-01-31 LAB — COMPREHENSIVE METABOLIC PANEL
ALT: 14 U/L (ref 0–44)
AST: 17 U/L (ref 15–41)
Albumin: 4.2 g/dL (ref 3.5–5.0)
Alkaline Phosphatase: 54 U/L (ref 38–126)
Anion gap: 8 (ref 5–15)
BUN: 6 mg/dL (ref 6–20)
CO2: 25 mmol/L (ref 22–32)
Calcium: 9.5 mg/dL (ref 8.9–10.3)
Chloride: 107 mmol/L (ref 98–111)
Creatinine, Ser: 0.72 mg/dL (ref 0.44–1.00)
GFR, Estimated: >60 mL/min (ref >60)
Glucose, Bld: 110 mg/dL — ABNORMAL HIGH (ref 70–99)
Potassium: 4.2 mmol/L (ref 3.5–5.1)
Sodium: 140 mmol/L (ref 135–145)
Total Bilirubin: 0.5 mg/dL (ref 0.3–1.2)
Total Protein: 7.4 g/dL (ref 6.5–8.1)

## 2022-01-31 LAB — SALICYLATE LEVEL: Salicylate Lvl: <7 mg/dL — ABNORMAL LOW (ref 7.0–30.0)

## 2022-01-31 LAB — ACETAMINOPHEN LEVEL: Acetaminophen (Tylenol), Serum: <10 ug/mL — ABNORMAL LOW (ref 10–30)

## 2022-01-31 LAB — ETHANOL: Alcohol, Ethyl (B): <10 mg/dL (ref <10)

## 2022-01-31 NOTE — Consult Note (Signed)
[facility] Consult   Reason for Consult: Anxiety  Referring Physician: Dr. Joni Fears Patient Identification: Charlene Hensley MRN:  [id number] Principal Diagnosis: <principal problem not specified> Diagnosis:  Active Problems:   MDD (major depressive disorder), recurrent episode, moderate (HCC)   Panic attacks   Adjustment disorder with mixed anxiety and depressed mood   Total Time spent with patient: 1 hour  Subjective: "My medications needs to be adjusted." Charlene Hensley is a 21 y.o. female patient presented to [facility] ED via POV voluntary. The patient complained that her psychiatric medications are not working, making her irritable and voicing passive suicidal ideation. The patient stated that she is getting married in April 2024 and is being pulled in every direction by her in-laws and her family. The patient shared that, at times, she is overwhelmed by everything that is going on.  This provider saw the patient face-to-face; the chart was reviewed, and consulted with Dr.Stafford on 01/31/2022 due to the patient's care. It was discussed with the EDP that the patient does not meet the criteria to be admitted to the psychiatric inpatient unit.  On evaluation, the patient is alert and oriented x 4, anxious, cooperative, and mood-congruent with affect. The patient does not appear to be responding to internal or external stimuli. Neither is the patient presenting with any delusional thinking. The patient denies auditory or visual hallucinations. The patient admits to passive suicidal ideation but denies homicidal or self-harm ideations. The patient is not presenting with any psychotic or paranoid behaviors. During an encounter with the patient, she could answer questions appropriately. The patient was discharged with outpatient resources for therapy.   Collateral was obtained from the patient's mother Charlene Hensley [phone]), who expressed concerns about her daughter's mental state.  Mom said the patient is getting married early next year and is being pulled in every direction by her future husband and his parents. She states her daughter was raised in a [place] family, and she is noticing her daughter is lying to them and she is not doing well mentally. Mom shared that she is not concerned that her daughter will harm herself but is more concerned that the patient is being pushed to where she is going against her [place] upbringing. Mom is very interested in the patient getting connected with a therapist. Mom was interested in the therapist is a [place].  HPI: Per Dr. Joni Fears, Charlene Hensley is a 21 y.o. female with a history of GERD, thyroid disease, anxiety who comes ED complaining of worsening anxiety attacks over the past month.  This is situational and that she is planning a wedding for April 2024 and is finding her future in-law parents difficult to work with on planning.  She denies any pain or injuries or ingestions.  No SI or HI.  No hallucinations.   Past Psychiatric History: History reviewed. No pertinent past psychiatric history  Risk to Self:   Risk to Others:   Prior Inpatient Therapy:   Prior Outpatient Therapy:    Past Medical History:  Past Medical History:  Diagnosis Date   Acid reflux    Thyroid disease     Past Surgical History:  Procedure Laterality Date   TONSILLECTOMY     Family History:  Family History  Problem Relation Age of Onset   Hypertension Paternal Grandfather    Mental illness Paternal Grandfather    Thyroid disease Paternal Grandmother    Cancer Paternal Grandmother    Diabetes Maternal Grandmother    Breast cancer Maternal Grandmother  Thyroid disease Maternal Grandmother    Diabetes Maternal Grandfather    Thyroid disease Mother    Thyroid disease Sister    Family Psychiatric  History: History reviewed. No pertinent family psychiatric history Social History:  Social History   Substance and Sexual Activity  Alcohol  Use Yes   Comment: monthly     Social History   Substance and Sexual Activity  Drug Use Never    Social History   Socioeconomic History   Marital status: Significant Other    Spouse name: Not on file   Number of children: Not on file   Years of education: Not on file   Highest education level: Not on file  Occupational History   Not on file  Tobacco Use   Smoking status: Never   Smokeless tobacco: Never  Vaping Use   Vaping Use: Never used  Substance and Sexual Activity   Alcohol use: Yes    Comment: monthly   Drug use: Never   Sexual activity: Never    Birth control/protection: Pill  Other Topics Concern   Not on file  Social History Narrative   Not on file   Social Determinants of Health   Financial Resource Strain: Low Risk  (10/25/2021)   Overall Financial Resource Strain (CARDIA)    Difficulty of Paying Living Expenses: Not hard at all  Food Insecurity: No Food Insecurity (10/25/2021)   Hunger Vital Sign    Worried About Running Out of Food in the Last Year: Never true    Ran Out of Food in the Last Year: Never true  Transportation Needs: No Transportation Needs (10/25/2021)   PRAPARE - Administrator, Civil Service (Medical): No    Lack of Transportation (Non-Medical): No  Physical Activity: Insufficiently Active (10/25/2021)   Exercise Vital Sign    Days of Exercise per Week: 2 days    Minutes of Exercise per Session: 60 min  Stress: Stress Concern Present (10/25/2021)   [employer] of Occupational Health - Occupational Stress Questionnaire    Feeling of Stress : Rather much  Social Connections: Moderately Isolated (10/25/2021)   Social Connection and Isolation Panel [NHANES]    Frequency of Communication with Friends and Family: Three times a week    Frequency of Social Gatherings with Friends and Family: More than three times a week    Attends Religious Services: More than 4 times per year    Active Member of [employer] or Organizations: No     Attends Banker Meetings: Never    Marital Status: Never married   Additional Social History:    Allergies:  No Known Allergies  Labs:  Results for orders placed or performed during the hospital encounter of 01/31/22 (from the past 48 hour(s))  Comprehensive metabolic panel     Status: Abnormal   Collection Time: 01/31/22  9:17 PM  Result Value Ref Range   Sodium 140 135 - 145 mmol/L   Potassium 4.2 3.5 - 5.1 mmol/L   Chloride 107 98 - 111 mmol/L   CO2 25 22 - 32 mmol/L   Glucose, Bld 110 (H) 70 - 99 mg/dL    Comment: Glucose reference range applies only to samples taken after fasting for at least 8 hours.   BUN 6 6 - 20 mg/dL   Creatinine, Ser 8.10 0.44 - 1.00 mg/dL   Calcium 9.5 8.9 - 17.5 mg/dL   Total Protein 7.4 6.5 - 8.1 g/dL   Albumin 4.2 3.5 - 5.0 g/dL  AST 17 15 - 41 U/L   ALT 14 0 - 44 U/L   Alkaline Phosphatase 54 38 - 126 U/L   Total Bilirubin 0.5 0.3 - 1.2 mg/dL   GFR, Estimated >35 >57 mL/min    Comment: (NOTE) Calculated using the CKD-EPI Creatinine Equation (2021)    Anion gap 8 5 - 15    Comment: Performed at [facility], [address] [place], [place] [address]  Ethanol     Status: None   Collection Time: 01/31/22  9:17 PM  Result Value Ref Range   Alcohol, Ethyl (B) <10 <10 mg/dL    Comment: (NOTE) Lowest detectable limit for serum alcohol is 10 mg/dL.  For medical purposes only. Performed at [facility], [address] [place], [place] [address]   Salicylate level     Status: Abnormal   Collection Time: 01/31/22  9:17 PM  Result Value Ref Range   Salicylate Lvl <7.0 (L) 7.0 - 30.0 mg/dL    Comment: Performed at [facility], [address] [place], [place] [address]  Acetaminophen level     Status: Abnormal   Collection Time: 01/31/22  9:17 PM  Result Value Ref Range   Acetaminophen (Tylenol), Serum <10 (L) 10 - 30 ug/mL    Comment: (NOTE) Therapeutic concentrations vary significantly. A  range of 10-30 ug/mL  may be an effective concentration for many patients. However, some  are best treated at concentrations outside of this range. Acetaminophen concentrations >150 ug/mL at 4 hours after ingestion  and >50 ug/mL at 12 hours after ingestion are often associated with  toxic reactions.  Performed at [facility], [address] [place], [place] [address]   cbc     Status: Abnormal   Collection Time: 01/31/22  9:17 PM  Result Value Ref Range   WBC 8.5 4.0 - 10.5 K/uL   RBC 4.71 3.87 - 5.11 MIL/uL   Hemoglobin 14.4 12.0 - 15.0 g/dL   HCT 17.6 16.0 - 73.7 %   MCV 90.7 80.0 - 100.0 fL   MCH 30.6 26.0 - 34.0 pg   MCHC 33.7 30.0 - 36.0 g/dL   RDW 10.6 (L) 26.9 - 48.5 %   Platelets 308 150 - 400 K/uL   nRBC 0.0 0.0 - 0.2 %    Comment: Performed at [facility], [address] [place], [place] [address]  Urine Drug Screen, Qualitative     Status: None   Collection Time: 01/31/22  9:18 PM  Result Value Ref Range   Tricyclic, Ur Screen NONE DETECTED NONE DETECTED   Amphetamines, Ur Screen NONE DETECTED NONE DETECTED   MDMA (Ecstasy)Ur Screen NONE DETECTED NONE DETECTED   Cocaine Metabolite,Ur [place] NONE DETECTED NONE DETECTED   Opiate, Ur Screen NONE DETECTED NONE DETECTED   Phencyclidine (PCP) Ur S NONE DETECTED NONE DETECTED   Cannabinoid 50 Ng, Ur [place] NONE DETECTED NONE DETECTED   Barbiturates, Ur Screen NONE DETECTED NONE DETECTED   Benzodiazepine, Ur Scrn NONE DETECTED NONE DETECTED   Methadone Scn, Ur NONE DETECTED NONE DETECTED    Comment: (NOTE) Tricyclics + metabolites, urine    Cutoff 1000 ng/mL Amphetamines + metabolites, urine  Cutoff 1000 ng/mL MDMA (Ecstasy), urine              Cutoff 500 ng/mL Cocaine Metabolite, urine          Cutoff 300 ng/mL Opiate + metabolites, urine        Cutoff 300 ng/mL Phencyclidine (  PCP), urine         Cutoff 25 ng/mL Cannabinoid, urine                 Cutoff 50 ng/mL Barbiturates + metabolites, urine   Cutoff 200 ng/mL Benzodiazepine, urine              Cutoff 200 ng/mL Methadone, urine                   Cutoff 300 ng/mL  The urine drug screen provides only a preliminary, unconfirmed analytical test result and should not be used for non-medical purposes. Clinical consideration and professional judgment should be applied to any positive drug screen result due to possible interfering substances. A more specific alternate chemical method must be used in order to obtain a confirmed analytical result. Gas chromatography / mass spectrometry (GC/MS) is the preferred confirm atory method. Performed at [facility], [address] [place], [place] [address]   POC urine preg, ED     Status: None   Collection Time: 01/31/22  9:25 PM  Result Value Ref Range   Preg Test, Ur NEGATIVE NEGATIVE    Comment:        THE SENSITIVITY OF THIS METHODOLOGY IS >24 mIU/mL     No current facility-administered medications for this encounter.   Current Outpatient Medications  Medication Sig Dispense Refill   gabapentin (NEURONTIN) 100 MG capsule Take 1 capsule (100 mg total) by mouth 2 (two) times daily. 60 capsule 0   levothyroxine (SYNTHROID) 100 MCG tablet Take 1 tablet (100 mcg total) by mouth daily. 90 tablet 1   norethindrone-ethinyl estradiol-FE (LOESTRIN FE) 1-20 MG-MCG tablet Take 1 tablet by mouth daily. 84 tablet 3   pantoprazole (PROTONIX) 40 MG tablet Take 40 mg by mouth daily. (Patient not taking: Reported on 01/31/2022)     SUMAtriptan (IMITREX) 50 MG tablet Take 1 tablet (50 mg total) by mouth every 2 (two) hours as needed for migraine. May repeat in 2 hours if headache persists or recurs. Do not exceed 200mg  per day. 10 tablet 0    Musculoskeletal: Strength & Muscle Tone: within normal limits Gait & Station: normal Patient leans: N/A  Psychiatric Specialty Exam:  Presentation  General Appearance:  Appropriate for Environment  Eye Contact: Fair  Speech: Clear and  Coherent  Speech Volume: Decreased  Handedness: Right   Mood and Affect  Mood: Depressed  Affect: Depressed   Thought Process  Thought Processes: Coherent  Descriptions of Associations:Intact  Orientation:Full (Time, Place and Person)  Thought Content:Logical  History of Schizophrenia/Schizoaffective disorder:No data recorded Duration of Psychotic Symptoms:No data recorded Hallucinations:Hallucinations: None  Ideas of Reference:None  Suicidal Thoughts:Suicidal Thoughts: Yes, Passive SI Passive Intent and/or Plan: Without Intent; Without Means to Carry Out  Homicidal Thoughts:Homicidal Thoughts: No   Sensorium  Memory: Immediate Good; Recent Good; Remote Good  Judgment: Poor  Insight: Poor   Executive Functions  Concentration: Fair  Attention Span: Fair  Recall: Fair  Fund of Knowledge: Fair  Language: Good   Psychomotor Activity  Psychomotor Activity: Psychomotor Activity: Normal   Assets  Assets: Communication Skills; Desire for Improvement; Social Support   Sleep  Sleep: Sleep: Fair Number of Hours of Sleep: 6   Physical Exam: Physical Exam Vitals and nursing note reviewed.  Constitutional:      General: She is in acute distress.     Appearance: Normal appearance. She is normal weight.  HENT:     Head: Normocephalic and atraumatic.  Right Ear: External ear normal.     Left Ear: External ear normal.     Nose: Nose normal.     Mouth/Throat:     Mouth: Mucous membranes are moist.  Cardiovascular:     Rate and Rhythm: Tachycardia present.  Pulmonary:     Effort: Pulmonary effort is normal.  Musculoskeletal:        General: Normal range of motion.     Cervical back: Normal range of motion and neck supple.  Neurological:     General: No focal deficit present.     Mental Status: She is oriented to person, place, and time.  Psychiatric:        Attention and Perception: Attention and perception normal.        Mood  and Affect: Mood is anxious and depressed. Affect is blunt.        Speech: Speech normal.        Behavior: Behavior is cooperative.        Thought Content: Thought content includes suicidal ideation.        Cognition and Memory: Cognition and memory normal.        Judgment: Judgment is inappropriate.    Review of Systems  Psychiatric/Behavioral:  Positive for depression and suicidal ideas. The patient is nervous/anxious.   All other systems reviewed and are negative.  Blood pressure (!) 133/95, pulse (!) 107, temperature 98.3 F (36.8 C), temperature source Oral, resp. rate 16, height 5\' 6"  (1.676 m), weight 81.6 kg, last menstrual period 01/03/2022, SpO2 98 %. Body mass index is 29.05 kg/m.  Treatment Plan Summary: Plan Patient does not meet criteria for psychiatric inpatient admission  Disposition: No evidence of imminent risk to self or others at present.   Patient does not meet criteria for psychiatric inpatient admission. Supportive therapy provided about ongoing stressors. Refer to IOP. Discussed crisis plan, support from social network, calling 911, coming to the Emergency Department, and calling Suicide Hotline.  Gillermo MurdochJacqueline Manville Rico, NP 01/31/2022 11:23 PM

## 2022-01-31 NOTE — ED Triage Notes (Signed)
Pt to ED from home c/o anxiety and panic attacks over the last month.  States was started on new depression and anxiety meds about 1 month ago but symptoms getting worse.  States hx of anxiety but never seen psych.  States thoughts of not wanting to be here but no specific plans, denies HI or A/V hallucinations, denies pain.  Pt dressed into hospital appropriate scrubs by this RN and Ariel, EDT.  Belongings placed into 1 bag and given to mom per pt's request.

## 2022-01-31 NOTE — Consult Note (Incomplete)
[facility]   Reason for Consult: Anxiety  Referring Physician: Dr. Scotty CourtStafford Patient Identification: Charlene FurryKayla Wycoff MRN:  [id number] Principal Diagnosis: <principal problem not specified> Diagnosis:  Active Problems:   MDD (major depressive disorder), recurrent episode, moderate (HCC)   Panic attacks   Adjustment disorder with mixed anxiety and depressed mood   Total Time spent with patient: 1 hour  Subjective: "My medications needs to be adjusted." Charlene Hensley is a 21 y.o. female patient presented to [facility] ED via POV voluntary. The patient complained that her psychiatric medications are not working, making her irritable and voicing passive suicidal ideation. The patient stated that she is getting married in April 2024 and is being pulled in every direction by her in-laws and her family. The patient shared that, at times, she is overwhelmed by everything that is going on.  This provider saw The patient face-to-face; the chart was reviewed, and consulted with Dr.Stafford on 01/31/2022 due to the patient's care. It was discussed with the EDP that the patient does not meet the criteria to be admitted to the psychiatric inpatient unit.  On evaluation, the patient is alert and oriented x 4, anxious, cooperative, and mood-congruent with affect.  The patient does not appear to be responding to internal or external stimuli. Neither is the patient presenting with any delusional thinking. The patient denies auditory or visual hallucinations. The patient admits to passive suicidal ideation but denies homicidal or self-harm ideations. The patient is not presenting with any psychotic or paranoid behaviors. During an encounter with the patient, she could answer questions appropriately. The patient was discharged with outpatient resources for therapy.   Collateral was obtained from the patient's mother Charlene Hensley(Charlene Hensley [phone]), who expressed concerns about her daughter's mental state.  Mom said the patient is getting married early next year and is being pulled in every direction by her future husband and his parents. She states her daughter was raised in a [place], and she is noticing her daughter is lying to them and she is not doing well mentally. Mom shared that she is not concerned that her daughter will harm herself but is more concerned that the patient is being pushed to where she is going against her [place] upbringing.      HPI: Per Dr. Scotty CourtStafford, Charlene Hensley is a 21 y.o. female with a history of GERD, thyroid disease, anxiety who comes ED complaining of worsening anxiety attacks over the past month.  This is situational and that she is planning a wedding for April 2024 and is finding her future in-law parents difficult to work with on planning.  She denies any pain or injuries or ingestions.  No SI or HI.  No hallucinations.   Past Psychiatric History: History reviewed. No pertinent past psychiatric history  Risk to Self:   Risk to Others:   Prior Inpatient Therapy:   Prior Outpatient Therapy:    Past Medical History:  Past Medical History:  Diagnosis Date  . Acid reflux   . Thyroid disease     Past Surgical History:  Procedure Laterality Date  . TONSILLECTOMY     Family History:  Family History  Problem Relation Age of Onset  . Hypertension Paternal Grandfather   . Mental illness Paternal Grandfather   . Thyroid disease Paternal Grandmother   . Cancer Paternal Grandmother   . Diabetes Maternal Grandmother   . Breast cancer Maternal Grandmother   . Thyroid disease Maternal Grandmother   . Diabetes Maternal Grandfather   .  Thyroid disease Mother   . Thyroid disease Sister    Family Psychiatric  History: History reviewed. No pertinent family psychiatric history Social History:  Social History   Substance and Sexual Activity  Alcohol Use Yes   Comment: monthly     Social History   Substance and Sexual Activity  Drug Use Never     Social History   Socioeconomic History  . Marital status: Significant Other    Spouse name: Not on file  . Number of children: Not on file  . Years of education: Not on file  . Highest education level: Not on file  Occupational History  . Not on file  Tobacco Use  . Smoking status: Never  . Smokeless tobacco: Never  Vaping Use  . Vaping Use: Never used  Substance and Sexual Activity  . Alcohol use: Yes    Comment: monthly  . Drug use: Never  . Sexual activity: Never    Birth control/protection: Pill  Other Topics Concern  . Not on file  Social History Narrative  . Not on file   Social Determinants of Health   Financial Resource Strain: Low Risk  (10/25/2021)   Overall Financial Resource Strain (CARDIA)   . Difficulty of Paying Living Expenses: Not hard at all  Food Insecurity: No Food Insecurity (10/25/2021)   Hunger Vital Sign   . Worried About Charity fundraiser in the Last Year: Never true   . Ran Out of Food in the Last Year: Never true  Transportation Needs: No Transportation Needs (10/25/2021)   PRAPARE - Transportation   . Lack of Transportation (Medical): No   . Lack of Transportation (Non-Medical): No  Physical Activity: Insufficiently Active (10/25/2021)   Exercise Vital Sign   . Days of Exercise per Week: 2 days   . Minutes of Exercise per Session: 60 min  Stress: Stress Concern Present (10/25/2021)   [place]   . Feeling of Stress : Rather much  Social Connections: Moderately Isolated (10/25/2021)   Social Connection and Isolation Panel [NHANES]   . Frequency of Communication with Friends and Family: Three times a week   . Frequency of Social Gatherings with Friends and Family: More than three times a week   . Attends Religious Services: More than 4 times per year   . Active Member of Clubs or Organizations: No   . Attends Archivist Meetings: Never   . Marital Status: Never  married   Additional Social History:    Allergies:  No Known Allergies  Labs:  Results for orders placed or performed during the hospital encounter of 01/31/22 (from the past 48 hour(s))  Comprehensive metabolic panel     Status: Abnormal   Collection Time: 01/31/22  9:17 PM  Result Value Ref Range   Sodium 140 135 - 145 mmol/L   Potassium 4.2 3.5 - 5.1 mmol/L   Chloride 107 98 - 111 mmol/L   CO2 25 22 - 32 mmol/L   Glucose, Bld 110 (H) 70 - 99 mg/dL    Comment: Glucose reference range applies only to samples taken after fasting for at least 8 hours.   BUN 6 6 - 20 mg/dL   Creatinine, Ser 0.72 0.44 - 1.00 mg/dL   Calcium 9.5 8.9 - 10.3 mg/dL   Total Protein 7.4 6.5 - 8.1 g/dL   Albumin 4.2 3.5 - 5.0 g/dL   AST 17 15 - 41 U/L   ALT 14 0 -  44 U/L   Alkaline Phosphatase 54 38 - 126 U/L   Total Bilirubin 0.5 0.3 - 1.2 mg/dL   GFR, Estimated >29 >52 mL/min    Comment: (NOTE) Calculated using the CKD-EPI Creatinine Equation (2021)    Anion gap 8 5 - 15    Comment: Performed at [facility], [address] [place], [place] [address]  Ethanol     Status: None   Collection Time: 01/31/22  9:17 PM  Result Value Ref Range   Alcohol, Ethyl (B) <10 <10 mg/dL    Comment: (NOTE) Lowest detectable limit for serum alcohol is 10 mg/dL.  For medical purposes only. Performed at [facility], [address] [place], [place] [address]   Salicylate level     Status: Abnormal   Collection Time: 01/31/22  9:17 PM  Result Value Ref Range   Salicylate Lvl <7.0 (L) 7.0 - 30.0 mg/dL    Comment: Performed at [facility], [address] [place], [place] [address]  Acetaminophen level     Status: Abnormal   Collection Time: 01/31/22  9:17 PM  Result Value Ref Range   Acetaminophen (Tylenol), Serum <10 (L) 10 - 30 ug/mL    Comment: (NOTE) Therapeutic concentrations vary significantly. A range of 10-30 ug/mL  may be an effective concentration for many patients.  However, some  are best treated at concentrations outside of this range. Acetaminophen concentrations >150 ug/mL at 4 hours after ingestion  and >50 ug/mL at 12 hours after ingestion are often associated with  toxic reactions.  Performed at [facility], [address] [place], [place] [address]   cbc     Status: Abnormal   Collection Time: 01/31/22  9:17 PM  Result Value Ref Range   WBC 8.5 4.0 - 10.5 K/uL   RBC 4.71 3.87 - 5.11 MIL/uL   Hemoglobin 14.4 12.0 - 15.0 g/dL   HCT 34.7 42.5 - 95.6 %   MCV 90.7 80.0 - 100.0 fL   MCH 30.6 26.0 - 34.0 pg   MCHC 33.7 30.0 - 36.0 g/dL   RDW 38.7 (L) 56.4 - 33.2 %   Platelets 308 150 - 400 K/uL   nRBC 0.0 0.0 - 0.2 %    Comment: Performed at [facility], [address] [place], [place] [address]  Urine Drug Screen, Qualitative     Status: None   Collection Time: 01/31/22  9:18 PM  Result Value Ref Range   Tricyclic, Ur Screen NONE DETECTED NONE DETECTED   Amphetamines, Ur Screen NONE DETECTED NONE DETECTED   MDMA (Ecstasy)Ur Screen NONE DETECTED NONE DETECTED   Cocaine Metabolite,Ur [place] NONE DETECTED NONE DETECTED   Opiate, Ur Screen NONE DETECTED NONE DETECTED   Phencyclidine (PCP) Ur S NONE DETECTED NONE DETECTED   Cannabinoid 50 Ng, Ur [place] NONE DETECTED NONE DETECTED   Barbiturates, Ur Screen NONE DETECTED NONE DETECTED   Benzodiazepine, Ur Scrn NONE DETECTED NONE DETECTED   Methadone Scn, Ur NONE DETECTED NONE DETECTED    Comment: (NOTE) Tricyclics + metabolites, urine    Cutoff 1000 ng/mL Amphetamines + metabolites, urine  Cutoff 1000 ng/mL MDMA (Ecstasy), urine              Cutoff 500 ng/mL Cocaine Metabolite, urine          Cutoff 300 ng/mL Opiate + metabolites, urine        Cutoff 300 ng/mL Phencyclidine (PCP), urine         Cutoff 25  ng/mL Cannabinoid, urine                 Cutoff 50 ng/mL Barbiturates + metabolites, urine  Cutoff 200 ng/mL Benzodiazepine, urine              Cutoff 200  ng/mL Methadone, urine                   Cutoff 300 ng/mL  The urine drug screen provides only a preliminary, unconfirmed analytical test result and should not be used for non-medical purposes. Clinical consideration and professional judgment should be applied to any positive drug screen result due to possible interfering substances. A more specific alternate chemical method must be used in order to obtain a confirmed analytical result. Gas chromatography / mass spectrometry (GC/MS) is the preferred confirm atory method. Performed at [facility], [address] [place], [place] [address]   POC urine preg, ED     Status: None   Collection Time: 01/31/22  9:25 PM  Result Value Ref Range   Preg Test, Ur NEGATIVE NEGATIVE    Comment:        THE SENSITIVITY OF THIS METHODOLOGY IS >24 mIU/mL     No current facility-administered medications for this encounter.   Current Outpatient Medications  Medication Sig Dispense Refill  . gabapentin (NEURONTIN) 100 MG capsule Take 1 capsule (100 mg total) by mouth 2 (two) times daily. 60 capsule 0  . levothyroxine (SYNTHROID) 100 MCG tablet Take 1 tablet (100 mcg total) by mouth daily. 90 tablet 1  . norethindrone-ethinyl estradiol-FE (LOESTRIN FE) 1-20 MG-MCG tablet Take 1 tablet by mouth daily. 84 tablet 3  . pantoprazole (PROTONIX) 40 MG tablet Take 40 mg by mouth daily. (Patient not taking: Reported on 01/31/2022)    . SUMAtriptan (IMITREX) 50 MG tablet Take 1 tablet (50 mg total) by mouth every 2 (two) hours as needed for migraine. May repeat in 2 hours if headache persists or recurs. Do not exceed 200mg  per day. 10 tablet 0    Musculoskeletal: Strength & Muscle Tone: within normal limits Gait & Station: normal Patient leans: N/A  Psychiatric Specialty Exam:  Presentation  General Appearance:  Appropriate for Environment  Eye Contact: Fair  Speech: Clear and Coherent  Speech  Volume: Decreased  Handedness: Right   Mood and Affect  Mood: Depressed  Affect: Depressed   Thought Process  Thought Processes: Coherent  Descriptions of Associations:Intact  Orientation:Full (Time, Place and Person)  Thought Content:Logical  History of Schizophrenia/Schizoaffective disorder:No data recorded Duration of Psychotic Symptoms:No data recorded Hallucinations:Hallucinations: None  Ideas of Reference:None  Suicidal Thoughts:Suicidal Thoughts: Yes, Passive SI Passive Intent and/or Plan: Without Intent; Without Means to Carry Out  Homicidal Thoughts:Homicidal Thoughts: No   Sensorium  Memory: Immediate Good; Recent Good; Remote Good  Judgment: Poor  Insight: Poor   Executive Functions  Concentration: Fair  Attention Span: Fair  Recall: Fair  Fund of Knowledge: Fair  Language: Good   Psychomotor Activity  Psychomotor Activity: Psychomotor Activity: Normal   Assets  Assets: Communication Skills; Desire for Improvement; Social Support   Sleep  Sleep: Sleep: Fair Number of Hours of Sleep: 6   Physical Exam: Physical Exam Vitals and nursing note reviewed.  Constitutional:      General: She is in acute distress.     Appearance: Normal appearance. She is normal weight.  HENT:     Head: Normocephalic and atraumatic.     Right Ear: External ear normal.  Left Ear: External ear normal.     Nose: Nose normal.     Mouth/Throat:     Mouth: Mucous membranes are moist.  Cardiovascular:     Rate and Rhythm: Tachycardia present.  Pulmonary:     Effort: Pulmonary effort is normal.  Musculoskeletal:        General: Normal range of motion.     Cervical back: Normal range of motion and neck supple.  Neurological:     General: No focal deficit present.     Mental Status: She is oriented to person, place, and time.  Psychiatric:        Attention and Perception: Attention and perception normal.        Mood and Affect: Mood  is anxious and depressed. Affect is blunt.        Speech: Speech normal.        Behavior: Behavior is cooperative.        Thought Content: Thought content includes suicidal ideation.        Cognition and Memory: Cognition and memory normal.        Judgment: Judgment is inappropriate.    Review of Systems  Psychiatric/Behavioral:  Positive for depression and suicidal ideas. The patient is nervous/anxious.   All other systems reviewed and are negative.  Blood pressure (!) 133/95, pulse (!) 107, temperature 98.3 F (36.8 C), temperature source Oral, resp. rate 16, height 5\' 6"  (1.676 m), weight 81.6 kg, last menstrual period 01/03/2022, SpO2 98 %. Body mass index is 29.05 kg/m.  Treatment Plan Summary: Plan Patient does meet criteria for psychiatric inpatient admission  Disposition: No evidence of imminent risk to self or others at present.   Patient does not meet criteria for psychiatric inpatient admission. Supportive therapy provided about ongoing stressors. Refer to IOP. Discussed crisis plan, support from social network, calling 911, coming to the Emergency Department, and calling Suicide Hotline.  01/05/2022, NP 01/31/2022 11:23 PM

## 2022-01-31 NOTE — BH Assessment (Signed)
TTS attached mental health outpatient resources to patient's AVS. This is to be printed with patient's discharge summary.

## 2022-01-31 NOTE — ED Provider Notes (Signed)
[facility] Provider Note    Event Date/Time   First MD Initiated Contact with Patient 01/31/22 2132     (approximate)   History   Anxiety   HPI  Charlene Hensley is a 21 y.o. female with a history of GERD, thyroid disease, anxiety who comes ED complaining of worsening anxiety attacks over the past month.  This is situational and that she is planning a wedding for April 2024 and is finding her future in-law parents difficult to work with on planning.  She denies any pain or injuries or ingestions.  No SI or HI.  No hallucinations.     Physical Exam   Triage Vital Signs: ED Triage Vitals [01/31/22 2111]  Enc Vitals Group     BP (!) 152/98     Pulse Rate (!) 108     Resp 18     Temp 98.3 F (36.8 C)     Temp Source Oral     SpO2 99 %     Weight 180 lb (81.6 kg)     Height 5\' 6"  (1.676 m)     Head Circumference      Peak Flow      Pain Score 0     Pain Loc      Pain Edu?      Excl. in GC?     Most recent vital signs: Vitals:   01/31/22 2111  BP: (!) 152/98  Pulse: (!) 108  Resp: 18  Temp: 98.3 F (36.8 C)  SpO2: 99%    General: Awake, no distress.  CV:  Good peripheral perfusion.  Regular rate rhythm Resp:  Normal effort.  Abd:  No distention.  Other:  Lucid, rational linear thought   ED Results / Procedures / Treatments   Labs (all labs ordered are listed, but only abnormal results are displayed) Labs Reviewed  COMPREHENSIVE METABOLIC PANEL - Abnormal; Notable for the following components:      Result Value   Glucose, Bld 110 (*)    All other components within normal limits  CBC - Abnormal; Notable for the following components:   RDW 11.4 (*)    All other components within normal limits  ETHANOL  SALICYLATE LEVEL  ACETAMINOPHEN LEVEL  URINE DRUG SCREEN, QUALITATIVE ([facility] ONLY)  POC URINE PREG, ED     RADIOLOGY    PROCEDURES:  Procedures   MEDICATIONS ORDERED IN ED: Medications - No data to  display   IMPRESSION / MDM / ASSESSMENT AND PLAN / ED COURSE  I reviewed the triage vital signs and the nursing notes.                              Differential diagnosis includes, but is not limited to, anxiety, anemia, dehydration, electrolyte abnormality, intoxication, pregnancy  Patient comes to the ED voluntarily for evaluation of anxiety symptoms.  At triage had some mild tachycardia which is resolved on my evaluation.  No worrisome acute symptoms.  She is on medications to help manage her anxiety symptoms.  Presentation discussed with psychiatry after their evaluation of the patient, I find her psychiatrically stable.  They have discussed the symptoms with her mother who will help support the patient today.  She has been provided resources to seek outpatient therapy services.  Doubt infection, PE, hyperthyroidism     FINAL CLINICAL IMPRESSION(S) / ED DIAGNOSES   Final diagnoses:  Anxiety     Rx /  DC Orders   ED Discharge Orders     None        Note:  This document was prepared using Dragon voice recognition software and may include unintentional dictation errors.   Carrie Mew, MD 01/31/22 2239

## 2022-01-31 NOTE — Discharge Instructions (Addendum)
Mental Health Outpatient Programs    [facility] [address]  [place], [place] [address] [phone] Monday - Friday 8:00am - 8:00pm

## 2022-02-01 ENCOUNTER — Ambulatory Visit (INDEPENDENT_AMBULATORY_CARE_PROVIDER_SITE_OTHER): Payer: Managed Care, Other (non HMO)

## 2022-02-01 DIAGNOSIS — Z23 Encounter for immunization: Secondary | ICD-10-CM

## 2022-02-01 DIAGNOSIS — F419 Anxiety disorder, unspecified: Secondary | ICD-10-CM | POA: Insufficient documentation

## 2022-02-02 ENCOUNTER — Other Ambulatory Visit: Payer: Self-pay | Admitting: Nurse Practitioner

## 2022-02-02 DIAGNOSIS — G2581 Restless legs syndrome: Secondary | ICD-10-CM

## 2022-02-06 ENCOUNTER — Encounter: Payer: Self-pay | Admitting: Family Medicine

## 2022-02-06 ENCOUNTER — Ambulatory Visit: Payer: Managed Care, Other (non HMO) | Admitting: Family Medicine

## 2022-02-06 DIAGNOSIS — F411 Generalized anxiety disorder: Secondary | ICD-10-CM

## 2022-02-06 MED ORDER — BUSPIRONE HCL 7.5 MG PO TABS: 7.5000 mg | ORAL_TABLET | Freq: Two times a day (BID) | ORAL | 6 refills | Status: DC

## 2022-02-06 NOTE — Progress Notes (Signed)
Subjective:  Patient ID: Charlene Hensley, female    DOB: [birth year]  Age: 21 y.o. MRN: [id number]  CC: Chief Complaint  Patient presents with   Anxiety    Pt arrives to follow up from ER follow up. Pt went to [facility] on 01/31/22 due to having suicidal thoughts. Pt not having those thoughts today.     HPI:  21 year old female with a history of depression and anxiety presents with worsening anxiety.  Patient recently seen in the ER on 10/24 with worsening anxiety.  Patient states that she is here for follow-up regarding this.  She is currently on Celexa 20 mg daily and BuSpar 5 mg daily.  She states that she sees a Social worker currently.  She is having some stress associated with work and with planning a wedding.  She would like to discuss treatment options today.  Patient Active Problem List   Diagnosis Date Noted   MDD (major depressive disorder), recurrent episode, moderate ([place]) 01/31/2022   GAD (generalized anxiety disorder) 01/31/2022   Panic attacks 01/31/2022    Social Hx   Social History   Socioeconomic History   Marital status: Significant Other    Spouse name: Not on file   Number of children: Not on file   Years of education: Not on file   Highest education level: Not on file  Occupational History   Not on file  Tobacco Use   Smoking status: Never   Smokeless tobacco: Never  Vaping Use   Vaping Use: Never used  Substance and Sexual Activity   Alcohol use: Yes    Comment: monthly   Drug use: Never   Sexual activity: Never    Birth control/protection: Pill  Other Topics Concern   Not on file  Social History Narrative   Not on file   Social Determinants of Health   Financial Resource Strain: Low Risk  (10/25/2021)   Overall Financial Resource Strain (CARDIA)    Difficulty of Paying Living Expenses: Not hard at all  Food Insecurity: No Food Insecurity (10/25/2021)   Hunger Vital Sign    Worried About Running Out of Food in the Last Year: Never true    Ran Out  of Food in the Last Year: Never true  Transportation Needs: No Transportation Needs (10/25/2021)   PRAPARE - Hydrologist (Medical): No    Lack of Transportation (Non-Medical): No  Physical Activity: Insufficiently Active (10/25/2021)   Exercise Vital Sign    Days of Exercise per Week: 2 days    Minutes of Exercise per Session: 60 min  Stress: Stress Concern Present (10/25/2021)   [place]    Feeling of Stress : Rather much  Social Connections: Moderately Isolated (10/25/2021)   Social Connection and Isolation Panel [NHANES]    Frequency of Communication with Friends and Family: Three times a week    Frequency of Social Gatherings with Friends and Family: More than three times a week    Attends Religious Services: More than 4 times per year    Active Member of Clubs or Organizations: No    Attends Archivist Meetings: Never    Marital Status: Never married    Review of Systems Per HPI  Objective:  BP 110/60   Pulse 98   Wt 178 lb 9.6 oz (81 kg)   LMP 01/03/2022   SpO2 99%   BMI 28.83 kg/m      02/06/2022  8:45 AM 01/31/2022   10:52 PM 01/31/2022    9:11 PM  BP/Weight  Systolic BP 650 354 656  Diastolic BP 60 95 98  Wt. (Lbs) 178.6  180  BMI 28.83 kg/m2  29.05 kg/m2    Physical Exam Vitals and nursing note reviewed.  Constitutional:      General: She is not in acute distress.    Appearance: Normal appearance.  HENT:     Head: Normocephalic and atraumatic.  Cardiovascular:     Rate and Rhythm: Normal rate and regular rhythm.  Pulmonary:     Effort: Pulmonary effort is normal.     Breath sounds: Normal breath sounds. No wheezing, rhonchi or rales.  Neurological:     Mental Status: She is alert.  Psychiatric:     Comments: Anxious.      Lab Results  Component Value Date   WBC 8.5 01/31/2022   HGB 14.4 01/31/2022   HCT 42.7 01/31/2022   PLT 308  01/31/2022   GLUCOSE 110 (H) 01/31/2022   ALT 14 01/31/2022   AST 17 01/31/2022   NA 140 01/31/2022   K 4.2 01/31/2022   CL 107 01/31/2022   CREATININE 0.72 01/31/2022   BUN 6 01/31/2022   CO2 25 01/31/2022   TSH 1.280 11/24/2021     Assessment & Plan:   Problem List Items Addressed This Visit       Other   GAD (generalized anxiety disorder)    Uncontrolled/worsening.  Continue current dosing of Celexa.  Increasing BuSpar to 7.5 mg twice daily.      Relevant Medications   citalopram (CELEXA) 20 MG tablet   traZODone (DESYREL) 50 MG tablet   busPIRone (BUSPAR) 7.5 MG tablet    Meds ordered this encounter  Medications   busPIRone (BUSPAR) 7.5 MG tablet    Sig: Take 1 tablet (7.5 mg total) by mouth 2 (two) times daily.    Dispense:  60 tablet    Refill:  6    Follow-up:  Return in about 6 months (around 08/08/2022) for Depression/Anxiety.  [place]

## 2022-02-06 NOTE — Assessment & Plan Note (Signed)
Uncontrolled/worsening.  Continue current dosing of Celexa.  Increasing BuSpar to 7.5 mg twice daily.

## 2022-02-06 NOTE — Patient Instructions (Signed)
I have increased the Buspar.  Continue the Celexa.  Follow up in 6 months or sooner if needed.  Take care  Dr. Lacinda Axon

## 2022-02-20 ENCOUNTER — Ambulatory Visit: Payer: Managed Care, Other (non HMO) | Admitting: Nurse Practitioner

## 2022-02-20 VITALS — BP 120/78 | HR 98 | Temp 98.8°F | Ht 66.0 in | Wt 178.4 lb

## 2022-02-20 DIAGNOSIS — R051 Acute cough: Secondary | ICD-10-CM | POA: Diagnosis not present

## 2022-02-20 NOTE — Progress Notes (Unsigned)
   Subjective:    Patient ID: Charlene Hensley, female    DOB: 08-15-2000, 21 y.o.   MRN: [id number]  Cough This is a new problem.   Chest congestion and cough for 2 weeks- now both ears are bothering her. Non productive cough.  Denies fever, chills, body aches, sore throat, SOB, wheezing.   Tested at work for RSV, COVID, and Flu which were negative.    Review of Systems  Respiratory:  Positive for cough.        Objective:   Physical Exam Vitals reviewed.  Constitutional:      General: She is not in acute distress.    Appearance: Normal appearance. She is normal weight. She is not ill-appearing, toxic-appearing or diaphoretic.  HENT:     Head: Normocephalic and atraumatic.     Right Ear: Tympanic membrane, ear canal and external ear normal.     Left Ear: Tympanic membrane, ear canal and external ear normal.     Nose: Nose normal. No congestion or rhinorrhea.     Mouth/Throat:     Mouth: Mucous membranes are moist.     Pharynx: Oropharynx is clear. No oropharyngeal exudate or posterior oropharyngeal erythema.  Eyes:     Extraocular Movements: Extraocular movements intact.     Conjunctiva/sclera: Conjunctivae normal.     Pupils: Pupils are equal, round, and reactive to light.  Cardiovascular:     Rate and Rhythm: Normal rate and regular rhythm.     Pulses: Normal pulses.     Heart sounds: Normal heart sounds. No murmur heard. Pulmonary:     Effort: Pulmonary effort is normal. No respiratory distress.     Breath sounds: Normal breath sounds. No wheezing.  Musculoskeletal:     Cervical back: Normal range of motion and neck supple. No rigidity or tenderness.     Comments: Grossly intact  Lymphadenopathy:     Cervical: No cervical adenopathy.  Skin:    General: Skin is warm.     Capillary Refill: Capillary refill takes less than 2 seconds.  Neurological:     Mental Status: She is alert.     Comments: Grossly intact  Psychiatric:        Mood and Affect: Mood normal.         Behavior: Behavior normal.           Assessment & Plan:   1. Acute cough - Suspect allergies. - Per patient, she was tested at work for COVID, Flu, and RSV which were negative. - Use Zyrtec and mucinex over the counter for symptoms -RTC if symptoms not better or worsen.

## 2022-02-22 ENCOUNTER — Encounter: Payer: Self-pay | Admitting: Nurse Practitioner

## 2022-02-23 ENCOUNTER — Ambulatory Visit: Payer: Managed Care, Other (non HMO) | Admitting: Nurse Practitioner

## 2022-03-07 ENCOUNTER — Telehealth: Payer: Self-pay | Admitting: Adult Health

## 2022-03-07 NOTE — Telephone Encounter (Addendum)
Pt concerned that she hasn't had a period this month, only spotting. She is on Loestrin and has been for about 4 months. She has had negative pregnancy tests, although she is not sexually active. Pt was advised that she may not have a period with the pill and to lets see what happens next month. Pt was advised to continue taking her birth control. Pt voiced understanding. JSY

## 2022-03-07 NOTE — Telephone Encounter (Signed)
Patient called stating that she hasn't had her period this month, only spotting. She has taken several pregnancy tests and they were all negative. Patient states she isn't sexually active. Please advise.

## 2022-03-30 ENCOUNTER — Ambulatory Visit: Payer: Managed Care, Other (non HMO)

## 2022-03-30 ENCOUNTER — Ambulatory Visit (INDEPENDENT_AMBULATORY_CARE_PROVIDER_SITE_OTHER): Payer: Managed Care, Other (non HMO)

## 2022-03-30 DIAGNOSIS — Z23 Encounter for immunization: Secondary | ICD-10-CM | POA: Diagnosis not present

## 2022-06-05 ENCOUNTER — Other Ambulatory Visit: Payer: Self-pay

## 2022-06-05 ENCOUNTER — Encounter: Payer: Self-pay | Admitting: Internal Medicine

## 2022-06-05 ENCOUNTER — Ambulatory Visit: Payer: Managed Care, Other (non HMO) | Admitting: Internal Medicine

## 2022-06-05 VITALS — BP 96/62 | HR 97 | Temp 98.2°F | Resp 16 | Ht 66.25 in | Wt 177.6 lb

## 2022-06-05 DIAGNOSIS — L500 Allergic urticaria: Secondary | ICD-10-CM

## 2022-06-05 DIAGNOSIS — J3089 Other allergic rhinitis: Secondary | ICD-10-CM

## 2022-06-05 DIAGNOSIS — K9049 Malabsorption due to intolerance, not elsewhere classified: Secondary | ICD-10-CM

## 2022-06-05 DIAGNOSIS — J343 Hypertrophy of nasal turbinates: Secondary | ICD-10-CM

## 2022-06-05 DIAGNOSIS — J302 Other seasonal allergic rhinitis: Secondary | ICD-10-CM

## 2022-06-05 MED ORDER — FLUTICASONE PROPIONATE 50 MCG/ACT NA SUSP: 2.0000 | Freq: Every day | NASAL | 5 refills | Status: DC

## 2022-06-05 MED ORDER — OLOPATADINE HCL 0.2 % OP SOLN: 1.0000 [drp] | Freq: Every day | OPHTHALMIC | 5 refills | Status: DC | PRN

## 2022-06-05 MED ORDER — AZELASTINE HCL 0.1 % NA SOLN: 1.0000 | Freq: Two times a day (BID) | NASAL | 5 refills | Status: DC

## 2022-06-05 MED ORDER — CETIRIZINE HCL 10 MG PO TABS: 10.0000 mg | ORAL_TABLET | Freq: Every day | ORAL | 5 refills | Status: AC

## 2022-06-05 NOTE — Patient Instructions (Addendum)
Allergic Rhinitis: - Positive skin test 05/2022: trees, grasses, weeds, mold, cat, dog, dust mite, horse, cockroach. - Avoidance measures discussed. - Use nasal saline rinses before nose sprays such as with Neilmed Sinus Rinse.  Use distilled water.   - Use Flonase 2 sprays each nostril daily. Aim upward and outward. - Use Azelastine 1-2 sprays each nostril twice daily as needed. Aim upward and outward. - Use Zyrtec 10 mg daily.  - For eyes, use Olopatadine or Ketotifen 1 eye drop daily as needed for itchy, watery eyes.  Available over the counter, if not covered by insurance.  - Consider allergy shots as long term control of your symptoms by teaching your immune system to be more tolerant of your allergy triggers.  Food Intolerance - Symptoms are not concerning for IgE mediated allergy.  SPT was negative to milk today so this is not consistent with milk allergy. - Could be a food intolerance for which there is no true treatment or testing other that eating in small amounts as tolerated or strict avoidance if symptoms are unbearable.   ALLERGEN AVOIDANCE MEASURES   Dust Mites Use central air conditioning and heat; and change the filter monthly.  Pleated filters work better than mesh filters.  Electrostatic filters may also be used; wash the filter monthly.  Window air conditioners may be used, but do not clean the air as well as a central air conditioner.  Change or wash the filter monthly. Keep windows closed.  Do not use attic fans.   Encase the mattress, box springs and pillows with zippered, dust proof covers. Wash the bed linens in hot water weekly.   Remove carpet, especially from the bedroom. Remove stuffed animals, throw pillows, dust ruffles, heavy drapes and other items that collect dust from the bedroom. Do not use a humidifier.   Use wood, vinyl or leather furniture instead of cloth furniture in the bedroom. Keep the indoor humidity at 30 - 40%.  Monitor with a humidity  gauge.  Molds - Indoor avoidance Use air conditioning to reduce indoor humidity.  Do not use a humidifier. Keep indoor humidity at 30 - 40%.  Use a dehumidifier if needed. In the bathroom use an exhaust fan or open a window after showering.  Wipe down damp surfaces after showering.  Clean bathrooms with a mold-killing solution (diluted bleach, or products like Tilex, etc) at least once a month. In the kitchen use an exhaust fan to remove steam from cooking.  Throw away spoiled foods immediately, and empty garbage daily.  Empty water pans below self-defrosting refrigerators frequently. Vent the clothes dryer to the outside. Limit indoor houseplants; mold grows in the dirt.  No houseplants in the bedroom. Remove carpet from the bedroom. Encase the mattress and box springs with a zippered encasing.  Molds - Outdoor avoidance Avoid being outside when the grass is being mowed, or the ground is tilled. Avoid playing in leaves, pine straw, hay, etc.  Dead plant materials contain mold. Avoid going into barns or grain storage areas. Remove leaves, clippings and compost from around the home.  Cockroach Limit spread of food around the house; especially keep food out of bedrooms. Keep food and garbage in closed containers with a tight lid.  Never leave food out in the kitchen.  Do not leave out pet food or dirty food bowls. Mop the kitchen floor and wash countertops at least once a week. Repair leaky pipes and faucets so there is no standing water to attract roaches. Plug  up cracks in the house through which cockroaches can enter. Use bait stations and approved pesticides to reduce cockroach infestation. Pollen Avoidance Pollen levels are highest during the mid-day and afternoon.  Consider this when planning outdoor activities. Avoid being outside when the grass is being mowed, or wear a mask if the pollen-allergic person must be the one to mow the grass. Keep the windows closed to keep pollen  outside of the home. Use an air conditioner to filter the air. Take a shower, wash hair, and change clothing after working or playing outdoors during pollen season. Pet Dander Keep the pet out of your bedroom and restrict it to only a few rooms. Be advised that keeping the pet in only one room will not limit the allergens to that room. Don't pet, hug or kiss the pet; if you do, wash your hands with soap and water. High-efficiency particulate air (HEPA) cleaners run continuously in a bedroom or living room can reduce allergen levels over time. Regular use of a high-efficiency vacuum cleaner or a central vacuum can reduce allergen levels. Giving your pet a bath at least once a week can reduce airborne allergen.

## 2022-06-05 NOTE — Progress Notes (Signed)
NEW PATIENT  Date of Service/Encounter:  06/05/22  Consult requested by: Coral Spikes, DO   Subjective:   Charlene Hensley (DOB: [birth year]) is a 22 y.o. female who presents to the clinic on 06/05/2022 with a chief complaint of Allergic Reaction (Says when she consumes ice cream she gets sick she vomits. When she is around cats he eyes swell shut. ) .    History obtained from: chart review and patient.   Food Reaction: Reports with ingestion of milk products such as ice cream, yogurt, cheese and milk, she gets burps, stomach cramps and vomiting. She has tried lactose free products without improvement.  She also has been treated for GERD without improvement of symptoms. No diarrhea.  Her PCP referred her in case this was an allergic reaction.   Rhinitis:  Started in childhood.  Symptoms include: nasal congestion, rhinorrhea, post nasal drainage, watery eyes, and itchy eyes Occurs year-round Potential triggers: not sure Treatments tried:  Zyrtec, Flonase, Xyzal, Allegra PRN Last use of anti histamine was in summertime  Previous allergy testing: no History of reflux/heartburn: yes History of sinus surgery: no Nonallergic triggers: none   Hives: She has also noted that when she is exposed to cats, she gets hives and eye swelling.    Past Medical History: Past Medical History:  Diagnosis Date   Acid reflux    Thyroid disease    Past Surgical History: Past Surgical History:  Procedure Laterality Date   TONSILLECTOMY  2003    Family History: Family History  Problem Relation Age of Onset   Hypertension Paternal Grandfather    Mental illness Paternal Grandfather    Thyroid disease Paternal Grandmother    Cancer Paternal Grandmother    Diabetes Maternal Grandmother    Breast cancer Maternal Grandmother    Thyroid disease Maternal Grandmother    Diabetes Maternal Grandfather    Thyroid disease Mother    Thyroid disease Sister     Social History:  Lives in a 25 year  house Flooring in bedroom: carpet Pets: cat and dog Tobacco use/exposure: none  Job: customer service   Medication List:  Allergies as of 06/05/2022   No Known Allergies      Medication List        Accurate as of June 05, 2022 12:46 PM. If you have any questions, ask your nurse or doctor.          STOP taking these medications    busPIRone 7.5 MG tablet Commonly known as: BUSPAR Stopped by: Larose Kells, MD   citalopram 20 MG tablet Commonly known as: CELEXA Stopped by: Larose Kells, MD   gabapentin 100 MG capsule Commonly known as: NEURONTIN Stopped by: Larose Kells, MD   pantoprazole 40 MG tablet Commonly known as: PROTONIX Stopped by: Larose Kells, MD   traZODone 50 MG tablet Commonly known as: DESYREL Stopped by: Larose Kells, MD       TAKE these medications    levothyroxine 100 MCG tablet Commonly known as: SYNTHROID Take 1 tablet (100 mcg total) by mouth daily.   norethindrone-ethinyl estradiol-FE 1-20 MG-MCG tablet Commonly known as: LOESTRIN FE Take 1 tablet by mouth daily.   SUMAtriptan 50 MG tablet Commonly known as: Imitrex Take 1 tablet (50 mg total) by mouth every 2 (two) hours as needed for migraine. May repeat in 2 hours if headache persists or recurs. Do not exceed '200mg'$  per day.         REVIEW OF SYSTEMS:  Pertinent positives and negatives discussed in HPI.   Objective:   Physical Exam: BP 96/62   Pulse 97   Temp 98.2 F (36.8 C) (Temporal)   Resp 16   Ht 5' 6.25" (1.683 m)   Wt 177 lb 9.6 oz (80.6 kg)   SpO2 98%   BMI 28.45 kg/m  Body mass index is 28.45 kg/m. GEN: alert, well developed HEENT: clear conjunctiva, TM grey and translucent, nose with + inferior turbinate hypertrophy, pink nasal mucosa, slight clear rhinorrhea, + cobblestoning HEART: regular rate and rhythm, no murmur LUNGS: clear to auscultation bilaterally, no coughing, unlabored respiration ABDOMEN: soft, non distended  SKIN: no rashes or  lesions  Reviewed:  02/20/2022: seen by [facility] NP for 2 weeks of cough, congestion, ear ache.  Thought to be allergic rhinitis. Prescribed Zyrtec and mucinex.   02/25/2022: seen in urgent care for cough and congestion for over 1 week. Thought to be acute sinusitis and bronchitis.  Started on tessalon perles, z pack and prednisone taper.   04/17/2022: seen by PCP Caroll PA for recurrent hives with exposure to cats.  Referred to Allergy.    Skin Testing:  Skin prick testing was placed, which includes aeroallergens/foods, histamine control, and saline control.  Verbal consent was obtained prior to placing test.  Patient tolerated procedure well.  Allergy testing results were read and interpreted by myself, documented by clinical staff. Adequate positive and negative control.  Results discussed with patient/family.  Airborne Adult Perc - 06/05/22 1114     Time Antigen Placed 1114    Allergen Manufacturer Lavella Hammock    Location Back    Number of Test 59    1. Control-Buffer 50% Glycerol Negative    2. Control-Histamine 1 mg/ml 3+    3. Albumin saline Negative    4. Carrabelle 3+    5. [place] 3+    6. Johnson 3+    7. Kentucky Blue 3+    8. Meadow Fescue 3+    9. Perennial Rye 3+    10. Sweet Vernal 3+    11. Timothy 3+    12. Cocklebur Negative    13. Burweed Marshelder Negative    14. Ragweed, short 3+    15. Ragweed, Giant 3+    16. Plantain,  English 3+    17. Lamb's Quarters 3+    18. Sheep Sorrell 3+    19. Rough Pigweed 3+    20. Marsh Elder, Rough 3+    21. Mugwort, Common 3+    22. Ash mix 3+    23. Birch mix Negative    24. Beech American 3+    25. Box, Elder 3+    26. Cedar, red 3+    27. Cottonwood, Eastern 3+    28. Elm mix 3+    29. Hickory 3+    30. Maple mix 3+    31. Oak, [place] mix 3+    32. Pecan Pollen 3+    33. Pine mix Negative    34. Sycamore Eastern 3+    35. Paoli, Black Pollen 3+    36. Alternaria alternata Negative    37. Cladosporium Herbarum  Negative    38. Aspergillus mix Negative    39. Penicillium mix Negative    40. Bipolaris sorokiniana (Helminthosporium) Negative    41. Drechslera spicifera (Curvularia) Negative    42. Mucor plumbeus Negative    43. Fusarium moniliforme Negative    44. Aureobasidium pullulans (pullulara) Negative    45. Rhizopus  oryzae Negative    46. Botrytis cinera Negative    47. Epicoccum nigrum Negative    48. Phoma betae Negative    49. Candida Albicans Negative    50. Trichophyton mentagrophytes Negative    51. Mite, D Farinae  5,000 AU/ml 3+    52. Mite, D Pteronyssinus  5,000 AU/ml 3+    53. Cat Hair 10,000 BAU/ml 3+    54.  Dog Epithelia Negative    55. Mixed Feathers Negative    56. Horse Epithelia 3+    57. Cockroach, German 2+    58. Mouse Negative    59. Tobacco Leaf Negative             Intradermal - 06/05/22 1145     Time Antigen Placed 1145    Allergen Manufacturer Lavella Hammock    Location Arm    Number of Test 6    Control Negative    Mold 1 Negative    Mold 2 Negative    Mold 3 2+    Mold 4 Negative    Dog 2+             Food Adult Perc - 06/05/22 1100     Time Antigen Placed 1115    Allergen Manufacturer Lavella Hammock    Location Back    Number of allergen test 1    5. Milk, cow Negative               Assessment:   1. Allergic urticaria   2. Nasal turbinate hypertrophy   3. Food intolerance   4. Seasonal and perennial allergic rhinitis     Plan/Recommendations:   Allergic Rhinitis: - Due to turbinate hypertrophy, seasonal symptoms and unresponsive to OTC meds, performed skin testing to identify aeroallergen triggers.   - Positive skin test 05/2022: trees, grasses, weeds, mold, cat, dog, dust mite, horse, cockroach. - Avoidance measures discussed. - Use nasal saline rinses before nose sprays such as with Neilmed Sinus Rinse.  Use distilled water.   - Use Flonase 2 sprays each nostril daily. Aim upward and outward. - Use Azelastine 1-2 sprays each  nostril twice daily as needed. Aim upward and outward. - Use Zyrtec 10 mg daily.  - For eyes, use Olopatadine or Ketotifen 1 eye drop daily as needed for itchy, watery eyes.  Available over the counter, if not covered by insurance.  - Consider allergy shots as long term control of your symptoms by teaching your immune system to be more tolerant of your allergy triggers.  Urticaria - Mostly with exposure to pets.  Her SPT was positive to pets today. - Recommend avoidance which isn't possible in her case. - Can do Zyrtec '10mg'$  twice daily if needed.   Food Intolerance - Symptoms are not concerning for IgE mediated allergy.  SPT was negative to milk today so this is not consistent with milk allergy. - Could be a food intolerance for which there is no true treatment or testing other that eating in small amounts as tolerated or strict avoidance if symptoms are unbearable.   Return in about 6 weeks (around 07/17/2022).  Harlon Flor, MD [facility]

## 2022-06-23 ENCOUNTER — Encounter: Payer: Self-pay | Admitting: Internal Medicine

## 2022-07-28 ENCOUNTER — Ambulatory Visit: Payer: Managed Care, Other (non HMO) | Admitting: Family Medicine

## 2022-07-31 ENCOUNTER — Encounter: Payer: Self-pay | Admitting: Internal Medicine

## 2022-07-31 ENCOUNTER — Other Ambulatory Visit: Payer: Self-pay

## 2022-07-31 ENCOUNTER — Ambulatory Visit: Payer: Managed Care, Other (non HMO) | Admitting: Internal Medicine

## 2022-07-31 VITALS — BP 118/72 | HR 88 | Temp 98.2°F | Resp 20 | Ht 66.0 in | Wt 179.0 lb

## 2022-07-31 DIAGNOSIS — J302 Other seasonal allergic rhinitis: Secondary | ICD-10-CM | POA: Diagnosis not present

## 2022-07-31 DIAGNOSIS — L500 Allergic urticaria: Secondary | ICD-10-CM | POA: Diagnosis not present

## 2022-07-31 DIAGNOSIS — J3089 Other allergic rhinitis: Secondary | ICD-10-CM | POA: Diagnosis not present

## 2022-07-31 MED ORDER — FLUTICASONE PROPIONATE 50 MCG/ACT NA SUSP: 2.0000 | Freq: Every day | NASAL | 5 refills | Status: DC

## 2022-07-31 MED ORDER — IPRATROPIUM BROMIDE 0.06 % NA SOLN: 2.0000 | Freq: Four times a day (QID) | NASAL | 5 refills | Status: DC | PRN

## 2022-07-31 MED ORDER — OLOPATADINE HCL 0.2 % OP SOLN: 1.0000 [drp] | Freq: Every day | OPHTHALMIC | 5 refills | Status: DC | PRN

## 2022-07-31 MED ORDER — FEXOFENADINE HCL 180 MG PO TABS: 180.0000 mg | ORAL_TABLET | Freq: Two times a day (BID) | ORAL | 5 refills | Status: AC | PRN

## 2022-07-31 MED ORDER — AZELASTINE HCL 0.1 % NA SOLN: 1.0000 | Freq: Two times a day (BID) | NASAL | 5 refills | Status: DC

## 2022-07-31 NOTE — Patient Instructions (Addendum)
Allergic Rhinitis: - Positive skin test 05/2022: trees, grasses, weeds, mold, cat, dog, dust mite, horse, cockroach. - Avoidance measures discussed. - Use nasal saline rinses before nose sprays such as with Neilmed Sinus Rinse.  Use distilled water.   - Use Flonase 2 sprays each nostril daily. Aim upward and outward. - Use Azelastine 1-2 sprays each nostril twice daily as needed. Aim upward and outward. - Use Ipratroprium 1-2 sprays up to four times daily as needed for runny nose. Aim upward and outward. - Use Allegra  daily.  Stop Zyrtec.  - For eyes, use Olopatadine or Ketotifen 1 eye drop daily as needed for itchy, watery eyes.  Available over the counter, if not covered by insurance.  - Consider allergy shots as long term control of your symptoms by teaching your immune system to be more tolerant of your allergy triggers.  Information given today; if interested in shots, please call us back and let us know and we will get this started.   Urticaria with Pets - Mostly with exposure to pets.   - Can do Allegra  twice daily if needed.

## 2022-07-31 NOTE — Progress Notes (Signed)
   FOLLOW UP Date of Service/Encounter:  07/31/22   Subjective:  Charlene Hensley (DOB: Mar 05, 2001) is a 22 y.o. female who [facility] for follow up for allergic rhinoconjunctivitis and urticaria.   History obtained from: chart review and patient. Last visit was with me on 06/05/2022 and was positive to both seasonal and perennial triggers. Started on Flonase/Azelastine/Zyrtec/Olopatadine.  Rhinoconjunctivitis Reports still having a lot of trouble with her allergies despite starting the medications. She still has congestion, runny nose, drainage, eye itching and swelling. She has also removed one of the triggers: cats.   Taking Flonase, Azelastine, Zyrtec, Olopatadine PRN.    Hives Still breaking out intermittently. Has not tried increased anti histamine dose.  Has removed the cats but discussed allergen remains in the house for months afterwards.    Past Medical History: Past Medical History:  Diagnosis Date   Acid reflux    Thyroid disease     Objective:  BP 118/72   Pulse 88   Temp 98.2 F (36.8 C)   Resp 20   Ht  (1.676 m)   Wt 179 lb (81.2 kg)   SpO2 97%   BMI 28.89 kg/m  Body mass index is 28.89 kg/m. Physical Exam: GEN: alert, well developed HEENT: clear conjunctiva, TM grey and translucent, nose with mild  inferior turbinate hypertrophy, pink nasal mucosa, clear rhinorrhea, no cobblestoning HEART: regular rate and rhythm, no murmur LUNGS: clear to auscultation bilaterally, no coughing, unlabored respiration SKIN: no rashes or lesions  Assessment:   1. Seasonal and perennial allergic rhinitis   2. Allergic urticaria     Plan/Recommendations:  Allergic Rhinitis: - Remains uncontrolled, will add Ipratropium and discussed AIT.  - Positive skin test 05/2022: trees, grasses, weeds, mold, cat, dog, dust mite, horse, cockroach. - Avoidance measures discussed. - Use nasal saline rinses before nose sprays such as with  Neilmed Sinus Rinse.  Use distilled water.   - Use Flonase 2 sprays each nostril daily. Aim upward and outward. - Use Azelastine 1-2 sprays each nostril twice daily as needed. Aim upward and outward. - Use Ipratroprium 1-2 sprays up to four times daily as needed for runny nose. Aim upward and outward. - Use Allegra  daily.  Stop Zyrtec.  - For eyes, use Olopatadine or Ketotifen 1 eye drop daily as needed for itchy, watery eyes.  Available over the counter, if not covered by insurance.  - Consider allergy shots as long term control of your symptoms by teaching your immune system to be more tolerant of your allergy triggers.  Information given today; if interested in shots, please call us back and let us know and we will get this started.   Urticaria with Pets - Uncontrolled. Has removed the cats from home but allergen remains for months after. Mostly with exposure to pets.   - Can do Allegra  twice daily if needed.       Return in about 3 months (around 10/30/2022).  Alesia Morin, MD [facility]

## 2022-08-08 ENCOUNTER — Ambulatory Visit: Payer: Managed Care, Other (non HMO) | Admitting: Family Medicine

## 2022-08-25 ENCOUNTER — Other Ambulatory Visit: Payer: Self-pay | Admitting: Adult Health

## 2022-09-13 ENCOUNTER — Other Ambulatory Visit: Payer: Self-pay | Admitting: Internal Medicine

## 2022-09-13 DIAGNOSIS — R1011 Right upper quadrant pain: Secondary | ICD-10-CM

## 2022-09-18 ENCOUNTER — Other Ambulatory Visit: Payer: Self-pay | Admitting: Internal Medicine

## 2022-09-18 DIAGNOSIS — R1011 Right upper quadrant pain: Secondary | ICD-10-CM

## 2022-09-21 ENCOUNTER — Encounter
Admission: RE | Admit: 2022-09-21 | Discharge: 2022-09-21 | Disposition: A | Discharge status: Home / Self Care | Payer: BC Managed Care – PPO | Source: Ambulatory Visit | Attending: Internal Medicine | Admitting: Internal Medicine

## 2022-09-21 DIAGNOSIS — R1011 Right upper quadrant pain: Secondary | ICD-10-CM | POA: Insufficient documentation

## 2022-09-21 MED ORDER — TECHNETIUM TC 99M MEBROFENIN IV KIT
5.0000 | PACK | Freq: Once | INTRAVENOUS | Status: AC | PRN
  Administered 2022-09-21: 4.83 via INTRAVENOUS

## 2022-10-05 ENCOUNTER — Ambulatory Visit: Payer: Managed Care, Other (non HMO) | Admitting: Adult Health

## 2022-10-30 ENCOUNTER — Ambulatory Visit: Payer: Managed Care, Other (non HMO) | Admitting: Internal Medicine

## 2022-11-21 ENCOUNTER — Encounter: Payer: Self-pay | Admitting: Adult Health

## 2022-11-21 ENCOUNTER — Ambulatory Visit (INDEPENDENT_AMBULATORY_CARE_PROVIDER_SITE_OTHER): Payer: Managed Care, Other (non HMO) | Admitting: Adult Health

## 2022-11-21 VITALS — BP 123/83 | HR 104 | Ht 65.0 in | Wt 187.5 lb

## 2022-11-21 DIAGNOSIS — Z3041 Encounter for surveillance of contraceptive pills: Secondary | ICD-10-CM | POA: Diagnosis not present

## 2022-11-21 DIAGNOSIS — Z01419 Encounter for gynecological examination (general) (routine) without abnormal findings: Secondary | ICD-10-CM

## 2022-11-21 MED ORDER — NORETHIN ACE-ETH ESTRAD-FE 1-20 MG-MCG PO TABS: 1.0000 | ORAL_TABLET | Freq: Every day | ORAL | 3 refills | Status: DC

## 2022-11-21 NOTE — Progress Notes (Signed)
Patient ID: Charlene Hensley, female   DOB: 2001-04-10, 22 y.o.   MRN: [id number] History of Present Illness: Charlene Hensley is a 22 year old white female, married, G0P0, in for well woman gyn exam.     Component Value Date/Time   DIAGPAP  10/25/2021 1102    - Negative for intraepithelial lesion or malignancy (NILM)   ADEQPAP  10/25/2021 1102    Satisfactory for evaluation; transformation zone component PRESENT.    PCP is Lianne Moris PA.   Current Medications, Allergies, Past Medical History, Past Surgical History, Family History and Social History were reviewed in [name] record.     Review of Systems: Patient denies any headaches, hearing loss, fatigue, blurred vision, shortness of breath, chest pain, abdominal pain, problems with bowel movements, urination, or intercourse. No joint pain or mood swings.     Physical Exam:BP 123/83 (BP Location: Left Arm, Patient Position: Sitting, Cuff Size: Normal)   Pulse (!) 104   Ht 5\' 5"  (1.651 m)   Wt 187 lb 8 oz (85 kg)   LMP 11/14/2022 (Approximate)   BMI 31.20 kg/m   General:  Well developed, well nourished, no acute distress Skin:  Warm and dry Neck:  Midline trachea, normal thyroid, good ROM, no lymphadenopathy Lungs; Clear to auscultation bilaterally Breast:  No dominant palpable mass, retraction, or nipple discharge Cardiovascular: Regular rate and rhythm Abdomen:  Soft, non tender, no hepatosplenomegaly Pelvic:  External genitalia is normal in appearance, no lesions.  The vagina is normal in appearance. Urethra has no lesions or masses. The cervix is smooth.  Uterus is felt to be normal size, shape, and contour.  No adnexal masses or tenderness noted.Bladder is non tender, no masses felt. Extremities/musculoskeletal:  No swelling or varicosities noted, no clubbing or cyanosis Psych:  No mood changes, alert and cooperative,seems happy AA is 1 Fall risk is low    11/21/2022   10:34 AM 02/06/2022    8:43 AM  01/26/2022   10:08 AM  Depression screen PHQ 2/9  Decreased Interest 0 0 3  Down, Depressed, Hopeless 0 3 3  PHQ - 2 Score 0 3 6  Altered sleeping 1 3 2   Tired, decreased energy 0 3 3  Change in appetite 0 2 3  Feeling bad or failure about yourself  0 3 3  Trouble concentrating 0 3 3  Moving slowly or fidgety/restless 0 2 3  Suicidal thoughts 0 2 2  PHQ-9 Score 1 21 25   Difficult doing work/chores  Very difficult Extremely dIfficult       11/21/2022   10:34 AM 02/06/2022    8:44 AM 01/26/2022   10:09 AM 10/25/2021   10:49 AM  GAD 7 : Generalized Anxiety Score  Nervous, Anxious, on Edge 0 3 3 2   Control/stop worrying 1 3 3 3   Worry too much - different things 1 3 3 1   Trouble relaxing 1 3 2 3   Restless 0 2 3 0  Easily annoyed or irritable 0 3 3 0  Afraid - awful might happen 0 3 3 1   Total GAD 7 Score 3 20 20 10   Anxiety Difficulty  Very difficult Extremely difficult     Upstream - 11/21/22 1041       Pregnancy Intention Screening   Does the patient want to become pregnant in the next year? No    Does the patient's partner want to become pregnant in the next year? No    Would the patient like to discuss  contraceptive options today? No      Contraception Wrap Up   Current Method Oral Contraceptive    End Method Oral Contraceptive    Contraception Counseling Provided Yes              Examination chaperoned by Malachy Mood LPN    Impression and Plan: 1. Encounter for well woman exam with routine gynecological exam Physical in 1 year Pap in 2026 Labs with PCP  2. Encounter for surveillance of contraceptive pills Happy with BCP, will refill blisovi 1/20  Meds ordered this encounter  Medications   norethindrone-ethinyl estradiol-FE (BLISOVI FE 1/20) 1-20 MG-MCG tablet    Sig: Take 1 tablet by mouth daily.    Dispense:  84 tablet    Refill:  3    Order Specific Question:   Supervising Provider    Answer:   Duane Lope H [[id number]]

## 2023-03-20 NOTE — Progress Notes (Unsigned)
   [address] [place] [address] Dept: [phone]  FOLLOW UP NOTE  Patient ID: Tanna Furry, female    DOB: 12-31-2000  Age: 22 y.o. MRN: [id number] Date of Office Visit: 03/21/2023  Assessment  Chief Complaint: No chief complaint on file.  HPI Ieasha Hnat is a 22 year old female who presents to the clinic for follow-up visit.  She was last seen in this clinic on 07/31/2022 by Dr. Allena Katz for evaluation of allergic rhinitis, allergic conjunctivitis, and urticaria especially with cat exposure.  Her last environmental allergy testing was on 06/05/2022 was positive to grass pollen, tree pollen, weed pollen, mold, cat, dog, cockroach, and horse.  Discussed the use of AI scribe software for clinical note transcription with the patient, who gave verbal consent to proceed.  History of Present Illness             Drug Allergies:  No Known Allergies  Physical Exam: There were no vitals taken for this visit.   Physical Exam  Diagnostics:    Assessment and Plan: No diagnosis found.  No orders of the defined types were placed in this encounter.   There are no Patient Instructions on file for this visit.  No follow-ups on file.    Thank you for the opportunity to care for this patient.  Please do not hesitate to contact me with questions.  Thermon Leyland, FNP [facility]

## 2023-03-20 NOTE — Patient Instructions (Incomplete)
Allergic Rhinitis: Continue allergen avoidance measures directed toward trees, grasses, weeds, mold, cat, dog, dust mite, horse, cockroach. Continue Flonase 2 sprays in each nostril once a day as needed for runny nose.  In the right nostril, point the applicator out toward the right ear. In the left nostril, point the applicator out toward the left ear Continue azelastine 1-2 sprays each nostril twice daily as needed. Aim upward and outward. Use Ipratroprium 1-2 sprays up to four times daily as needed for runny nose. Aim upward and outward. Consider saline nasal rinses as needed for nasal symptoms. Use this before any medicated nasal sprays for best result Continue Allegra 180mg  daily if needed for runny nose or itch Consider allergy shots as long term control of your symptoms by teaching your immune system to be more tolerant of your allergy triggers.  Information given today; if interested in shots, please call us back and let us know and we will get this started.   Allergic conjunctivitis Some over the counter eye drops include Pataday one drop in each eye once a day as needed for red, itchy eyes OR Zaditor one drop in each eye twice a day as needed for red itchy eyes. Avoid eye drops that say red eye relief as they may contain medications that dry out your eyes.   Urticaria with Pets Mostly with exposure to pets.   Continue Allegra 180 mg once a day.  You may take an additional dose of Allegra 180 mg once a day if needed for breakthrough symptoms  Call the clinic if this treatment plan is not working well for you.  Follow up in *** or sooner if needed.  Reducing Pollen Exposure The American Academy of Allergy, Asthma and Immunology suggests the following steps to reduce your exposure to pollen during allergy seasons. Do not hang sheets or clothing out to dry; pollen may collect on these items. Do not mow lawns or spend time around freshly cut grass; mowing stirs up pollen. Keep windows  closed at night.  Keep car windows closed while driving. Minimize morning activities outdoors, a time when pollen counts are usually at their highest. Stay indoors as much as possible when pollen counts or humidity is high and on windy days when pollen tends to remain in the air longer. Use air conditioning when possible.  Many air conditioners have filters that trap the pollen spores. Use a HEPA room air filter to remove pollen form the indoor air you breathe.  Control of Mold Allergen Mold and fungi can grow on a variety of surfaces provided certain temperature and moisture conditions exist.  Outdoor molds grow on plants, decaying vegetation and soil.  The major outdoor mold, Alternaria and Cladosporium, are found in very high numbers during hot and dry conditions.  Generally, a late Summer - Fall peak is seen for common outdoor fungal spores.  Rain will temporarily lower outdoor mold spore count, but counts rise rapidly when the rainy period ends.  The most important indoor molds are Aspergillus and Penicillium.  Dark, humid and poorly ventilated basements are ideal sites for mold growth.  The next most common sites of mold growth are the bathroom and the kitchen.  Outdoor [employer] Use air conditioning and keep windows closed Avoid exposure to decaying vegetation. Avoid leaf raking. Avoid grain handling. Consider wearing a face mask if working in moldy areas.  Indoor Mold Control Maintain humidity below 50%. Clean washable surfaces with 5% bleach solution. Remove sources e.g. Contaminated carpets.  Control of Dust Mite Allergen Dust mites play a major role in allergic asthma and rhinitis. They occur in environments with high humidity wherever human skin is found. Dust mites absorb humidity from the atmosphere (ie, they do not drink) and feed on organic matter (including shed human and animal skin). Dust mites are a microscopic type of insect that you cannot see with the naked eye. High  levels of dust mites have been detected from mattresses, pillows, carpets, upholstered furniture, bed covers, clothes, soft toys and any woven material. The principal allergen of the dust mite is found in its feces. A gram of dust may contain 1,000 mites and 250,000 fecal particles. Mite antigen is easily measured in the air during house cleaning activities. Dust mites do not bite and do not cause harm to humans, other than by triggering allergies/asthma.  Ways to decrease your exposure to dust mites in your home:  1. Encase mattresses, box springs and pillows with a mite-impermeable barrier or cover  2. Wash sheets, blankets and drapes weekly in hot water (130 F) with detergent and dry them in a dryer on the hot setting.  3. Have the room cleaned frequently with a vacuum cleaner and a damp dust-mop. For carpeting or rugs, vacuuming with a vacuum cleaner equipped with a high-efficiency particulate air (HEPA) filter. The dust mite allergic individual should not be in a room which is being cleaned and should wait 1 hour after cleaning before going into the room.  4. Do not sleep on upholstered furniture (eg, couches).  5. If possible removing carpeting, upholstered furniture and drapery from the home is ideal. Horizontal blinds should be eliminated in the rooms where the person spends the most time (bedroom, study, television room). Washable vinyl, roller-type shades are optimal.  6. Remove all non-washable stuffed toys from the bedroom. Wash stuffed toys weekly like sheets and blankets above.  7. Reduce indoor humidity to less than 50%. Inexpensive humidity monitors can be purchased at most hardware stores. Do not use a humidifier as can make the problem worse and are not recommended.  Control of Dog or Cat Allergen Avoidance is the best way to manage a dog or cat allergy. If you have a dog or cat and are allergic to dog or cats, consider removing the dog or cat from the home. If you have a dog  or cat but don't want to find it a new home, or if your family wants a pet even though someone in the household is allergic, here are some strategies that may help keep symptoms at bay:  Keep the pet out of your bedroom and restrict it to only a few rooms. Be advised that keeping the dog or cat in only one room will not limit the allergens to that room. Don't pet, hug or kiss the dog or cat; if you do, wash your hands with soap and water. High-efficiency particulate air (HEPA) cleaners run continuously in a bedroom or living room can reduce allergen levels over time. Regular use of a high-efficiency vacuum cleaner or a central vacuum can reduce allergen levels. Giving your dog or cat a bath at least once a week can reduce airborne allergen.  Control of Cockroach Allergen Cockroach allergen has been identified as an important cause of acute attacks of asthma, especially in urban settings.  There are fifty-five species of cockroach that exist in the [place], however only three, the [place], [place] species produce allergen that can affect patients with Asthma.  Allergens can be obtained from fecal particles, egg casings and secretions from cockroaches.    Remove food sources. Reduce access to water. Seal access and entry points. Spray runways with 0.5-1% Diazinon or Chlorpyrifos Blow boric acid power under stoves and refrigerator. Place bait stations (hydramethylnon) at feeding sites.

## 2023-03-21 ENCOUNTER — Ambulatory Visit: Payer: Managed Care, Other (non HMO) | Admitting: Family Medicine

## 2023-07-27 ENCOUNTER — Other Ambulatory Visit: Payer: Self-pay | Admitting: Adult Health

## 2023-09-11 ENCOUNTER — Other Ambulatory Visit: Payer: Self-pay | Admitting: Internal Medicine

## 2023-09-11 DIAGNOSIS — R1011 Right upper quadrant pain: Secondary | ICD-10-CM

## 2023-11-22 ENCOUNTER — Encounter: Payer: Self-pay | Admitting: Physician Assistant

## 2024-01-09 ENCOUNTER — Ambulatory Visit: Admitting: Adult Health

## 2024-01-14 NOTE — Progress Notes (Unsigned)
 01/15/2024 Charlene Hensley [id number] October 05, 2000  Referring provider: Dow Longs, PA-C Primary GI doctor: Dr. Suzann  ASSESSMENT AND PLAN:  Dysphagia for years with regurgitation, epigastric pain, can be liquids and solids No weight loss, no melena Rare NSAIDS, no ETOH Has allergies, no rashes/eczema Possible EOE, rule out stricture, achalasia with issues with liquids, GERD, hyplori - continue pantoprazole 40 mg daily -Schedule EGD with dilatation to evaluate for stenosis, tumor, erosive/infectious esophagititis, and EOE. I discussed risks of EGD with patient today, including risk of sedation, bleeding or perforation.  Patient provides understanding and gave verbal consent to proceed. - will schedule for barium swallow with issues with liquids/solids, rule out achalasia -In the interim patient advised about swallowing precautions.  -Eat slowly, chew food well before swallowing.  -Drink liquids in between each bite to avoid food impaction. - ER precautions discussed with the patient  I have reviewed the clinic note as outlined by Alan Coombs, PA and agree with the assessment, plan and medical decision making.  [name] presents to the office for evaluation of GERD, regurgitation, dysphagia and epigastric abdominal pain.  Symptoms have been longstanding for many years.  Describes a sensation of intermittently choking on solids and liquids.  Upper abdominal pain described as a burning sensation.  Agree it is appropriate to pursue EGD to rule out EOE, H. pylori, celiac disease, peptic ulcer disease.  Inocente Suzann, MD  Patient Care Team: Dow Longs, PA-C as PCP - General (Family Medicine)  HISTORY OF PRESENT ILLNESS: 23 y.o. female with a past medical history listed below presents as a new patient for evaluation of dysphagia.   Discussed the use of AI scribe software for clinical note transcription with the patient, who gave verbal consent to proceed.  History of Present  Illness   Charlene Hensley is a 23 year old female with acid reflux who presents with dysphagia and acid reflux. She is accompanied by Will, whose relationship to her is not specified.  She has experienced dysphagia for a couple of years, with difficulty swallowing both solids and liquids. She describes a sensation of food getting 'choked' and not going down, often requiring her to regurgitate it. This issue has become more frequent and socially embarrassing, particularly when dining out, leading her to avoid such situations.  She has a history of acid reflux and has been taking pantoprazole once daily since July, which has provided some relief. Despite this, she continues to experience upper abdominal pain after eating, described as a burning sensation. No weight loss, dark stools, or nausea are reported, but she notes early satiety.  Her bowel movements are regular without diarrhea or constipation. No family history of colon cancer, GI cancer, inflammatory bowel disease, or autoimmune conditions like celiac disease. She denies any history of neck radiation or surgery.  She has allergies but no history of eczema or asthma. She does not consume alcohol and rarely uses ibuprofen, only for headaches. She is on birth control and denies any chance of pregnancy.     She  reports that she has never smoked. She has never been exposed to tobacco smoke. She has never used smokeless tobacco. She reports current alcohol use. She reports that she does not use drugs.  RELEVANT GI HISTORY, IMAGING AND LABS: Results          CBC    Component Value Date/Time   WBC 8.5 01/31/2022 2117   RBC 4.71 01/31/2022 2117   HGB 14.4 01/31/2022 2117   HGB  13.8 12/29/2021 0908   HCT 42.7 01/31/2022 2117   HCT 40.0 12/29/2021 0908   PLT 308 01/31/2022 2117   PLT 306 12/29/2021 0908   MCV 90.7 01/31/2022 2117   MCV 89 12/29/2021 0908   MCH 30.6 01/31/2022 2117   MCHC 33.7 01/31/2022 2117   RDW 11.4 (L) 01/31/2022 2117    RDW 11.6 (L) 12/29/2021 0908   LYMPHSABS 1.8 12/29/2021 0908   EOSABS 0.4 12/29/2021 0908   BASOSABS 0.1 12/29/2021 0908   No results for input(s): HGB in the last 8760 hours.  CMP     Component Value Date/Time   NA 140 01/31/2022 2117   NA 137 12/29/2021 0908   K 4.2 01/31/2022 2117   CL 107 01/31/2022 2117   CO2 25 01/31/2022 2117   GLUCOSE 110 (H) 01/31/2022 2117   BUN 6 01/31/2022 2117   BUN 5 (L) 12/29/2021 0908   CREATININE 0.72 01/31/2022 2117   CALCIUM 9.5 01/31/2022 2117   PROT 7.4 01/31/2022 2117   PROT 6.2 12/29/2021 0908   ALBUMIN 4.2 01/31/2022 2117   ALBUMIN 4.1 12/29/2021 0908   AST 17 01/31/2022 2117   ALT 14 01/31/2022 2117   ALKPHOS 54 01/31/2022 2117   BILITOT 0.5 01/31/2022 2117   BILITOT 0.4 12/29/2021 0908   GFRNONAA >60 01/31/2022 2117      Latest Ref Rng & Units 01/31/2022    9:17 PM 12/29/2021    9:08 AM  Hepatic Function  Total Protein 6.5 - 8.1 g/dL 7.4  6.2   Albumin 3.5 - 5.0 g/dL 4.2  4.1   AST 15 - 41 U/L 17  11   ALT 0 - 44 U/L 14  14   Alk Phosphatase 38 - 126 U/L 54  57   Total Bilirubin 0.3 - 1.2 mg/dL 0.5  0.4       Current Medications:   Current Outpatient Medications (Endocrine & Metabolic):    BLISOVI FE 1/20 8-79 MG-MCG tablet, TAKE 1 TABLET BY MOUTH DAILY   levothyroxine  (SYNTHROID ) 100 MCG tablet, Take 1 tablet (100 mcg total) by mouth daily.   Current Outpatient Medications (Respiratory):    cetirizine  (ZYRTEC  ALLERGY ) 10 MG tablet, Take 1 tablet (10 mg total) by mouth daily.   fexofenadine  (ALLEGRA  ALLERGY ) 180 MG tablet, Take 1 tablet (180 mg total) by mouth 2 (two) times daily as needed for allergies (hives).  Current Outpatient Medications (Analgesics):    SUMAtriptan  (IMITREX ) 50 MG tablet, Take 1 tablet (50 mg total) by mouth every 2 (two) hours as needed for migraine. May repeat in 2 hours if headache persists or recurs. Do not exceed 200mg  per day.   Current Outpatient Medications (Other):     amoxicillin (AMOXIL) 500 MG capsule, Take 1,000 mg by mouth 2 (two) times daily.   pantoprazole (PROTONIX) 40 MG tablet, Take 40 mg by mouth daily.  Medical History:  Past Medical History:  Diagnosis Date   Acid reflux    Thyroid disease    Allergies: No Known Allergies   Surgical History:  She  has a past surgical history that includes Tonsillectomy (2003). Family History:  Her family history includes Breast cancer in her maternal grandmother; Cancer in her paternal grandmother; Diabetes in her maternal grandfather and maternal grandmother; Hypertension in her paternal grandfather; Mental illness in her paternal grandfather; Thyroid disease in her maternal grandmother, mother, paternal grandmother, and sister.  REVIEW OF SYSTEMS  : All other systems reviewed and negative except where noted in the History  of Present Illness.  PHYSICAL EXAM: BP 118/72   Pulse 100   Ht 5' 5 (1.651 m)   Wt 190 lb (86.2 kg)   BMI 31.62 kg/m  Physical Exam   GENERAL APPEARANCE: Well nourished, in no apparent distress. HEENT: No cervical lymphadenopathy, unremarkable thyroid, sclerae anicteric, conjunctiva pink. RESPIRATORY: Respiratory effort normal, breath sounds equal bilaterally without rales, rhonchi, or wheezing. CARDIO: Regular rate and rhythm with no murmurs, rubs, or gallops, peripheral pulses intact. ABDOMEN: Soft, non-distended, active bowel sounds in all four quadrants, no tenderness to palpation, no rebound, no mass appreciated. Abdomen normal on examination. RECTAL: Declines. MUSCULOSKELETAL: Full range of motion, normal gait, without edema. SKIN: Dry, intact without rashes or lesions. No jaundice. NEURO: Alert, oriented, no focal deficits. PSYCH: Cooperative, normal mood and affect.      Alan JONELLE Coombs, PA-C 10:00 AM

## 2024-01-15 ENCOUNTER — Encounter: Payer: Self-pay | Admitting: Physician Assistant

## 2024-01-15 ENCOUNTER — Ambulatory Visit (INDEPENDENT_AMBULATORY_CARE_PROVIDER_SITE_OTHER): Admitting: Physician Assistant

## 2024-01-15 VITALS — BP 118/72 | HR 100 | Ht 65.0 in | Wt 190.0 lb

## 2024-01-15 DIAGNOSIS — F411 Generalized anxiety disorder: Secondary | ICD-10-CM | POA: Diagnosis not present

## 2024-01-15 DIAGNOSIS — J3089 Other allergic rhinitis: Secondary | ICD-10-CM

## 2024-01-15 DIAGNOSIS — K219 Gastro-esophageal reflux disease without esophagitis: Secondary | ICD-10-CM

## 2024-01-15 DIAGNOSIS — R1310 Dysphagia, unspecified: Secondary | ICD-10-CM

## 2024-01-15 DIAGNOSIS — R1319 Other dysphagia: Secondary | ICD-10-CM

## 2024-01-15 NOTE — Patient Instructions (Addendum)
 Behavioral and Dietary Strategies for Management of Esophageal Dysmotility/dysphagia 1. Take reflux medications 30+ minutes before food in the morning.  2. Begin meals with warm beverage 3. Eat smaller more frequent meals 4. Eat slowly, taking small bites and sips 5. Alternate solids and liquids 6. Avoid foods/liquids that increase acid production 7. Sit upright during and for 30+ minutes after meals to facilitate esophageal clearing 8. Can try altoid melting in mouth before food  You have been scheduled for an endoscopy. Please follow written instructions given to you at your visit today.  If you use inhalers (even only as needed), please bring them with you on the day of your procedure.  If you take any of the following medications, they will need to be adjusted prior to your procedure:   DO NOT TAKE 7 DAYS PRIOR TO TEST- Trulicity (dulaglutide) Ozempic, Wegovy (semaglutide) Mounjaro (tirzepatide) Bydureon Bcise (exanatide extended release)  DO NOT TAKE 1 DAY PRIOR TO YOUR TEST Rybelsus (semaglutide) Adlyxin (lixisenatide) Victoza (liraglutide) Byetta (exanatide) ___________________________________________________________________________   You have been scheduled for a Barium Esophogram at [facility] (1st floor of the hospital) on 01/22/2024 at 10 am. Please arrive 30 minutes prior to your appointment for registration. Make certain not to have anything to eat or drink 3 hours prior to your test. If you need to reschedule for any reason, please contact radiology at [phone] to do so. __________________________________________________________________ A barium swallow is an examination that concentrates on views of the esophagus. This tends to be a double contrast exam (barium and two liquids which, when combined, create a gas to distend the wall of the oesophagus) or single contrast (non-ionic iodine based). The study is usually tailored to your symptoms so a good  history is essential. Attention is paid during the study to the form, structure and configuration of the esophagus, looking for functional disorders (such as aspiration, dysphagia, achalasia, motility and reflux) EXAMINATION You may be asked to change into a gown, depending on the type of swallow being performed. A radiologist and radiographer will perform the procedure. The radiologist will advise you of the type of contrast selected for your procedure and direct you during the exam. You will be asked to stand, sit or lie in several different positions and to hold a small amount of fluid in your mouth before being asked to swallow while the imaging is performed .In some instances you may be asked to swallow barium coated marshmallows to assess the motility of a solid food bolus. The exam can be recorded as a digital or video fluoroscopy procedure. POST PROCEDURE It will take 1-2 days for the barium to pass through your system. To facilitate this, it is important, unless otherwise directed, to increase your fluids for the next 24-48hrs and to resume your normal diet.  This test typically takes about 30 minutes to perform. __________________________________________________________________________________   Due to recent changes in healthcare laws, you may see the results of your imaging and laboratory studies on MyChart before your provider has had a chance to review them.  We understand that in some cases there may be results that are confusing or concerning to you. Not all laboratory results come back in the same time frame and the provider may be waiting for multiple results in order to interpret others.  Please give us  48 hours in order for your provider to thoroughly review all the results before contacting the office for clarification of your results.    I appreciate the  opportunity to  care for you  Thank You   [facility]

## 2024-01-22 ENCOUNTER — Ambulatory Visit (HOSPITAL_COMMUNITY)
Admission: RE | Admit: 2024-01-22 | Discharge: 2024-01-22 | Disposition: A | Discharge status: Home / Self Care | Source: Ambulatory Visit | Attending: Physician Assistant | Admitting: Physician Assistant

## 2024-01-22 ENCOUNTER — Ambulatory Visit: Payer: Self-pay | Admitting: Physician Assistant

## 2024-01-22 DIAGNOSIS — R1319 Other dysphagia: Secondary | ICD-10-CM | POA: Insufficient documentation

## 2024-01-22 NOTE — Progress Notes (Unsigned)
 [facility] History and Physical   Primary Care Physician:  Dow Longs, PA-C   Reason for Procedure:  Dysphagia, regurgitation, epigastric abdominal pain  Plan:    Upper endoscopy with possible dilation     HPI: Charlene Hensley is a 23 y.o. female undergoing upper endoscopy with possible dilation for investigation of dysphagia, regurgitation and epigastric abdominal pain.   Charlene Hensley was recently seen in clinic reporting a history of GERD for which she has been taking pantoprazole 40 mg orally daily with partial relief.  Despite PPI continues to experience upper abdominal pain described as a burning sensation as well as dysphagia and regurgitation.  No prior EGD.  Barium esophagram performed 01/22/2024 showed a tiny hiatal hernia but otherwise no evidence of esophageal dysmotility or stricture.   Past Medical History:  Diagnosis Date   Acid reflux    Thyroid disease     Past Surgical History:  Procedure Laterality Date   TONSILLECTOMY  2003    Prior to Admission medications   Medication Sig Start Date End Date Taking? Authorizing Provider  amoxicillin (AMOXIL) 500 MG capsule Take 1,000 mg by mouth 2 (two) times daily. 11/17/22   [provider]  BLISOVI FE 1/20 1-20 MG-MCG tablet TAKE 1 TABLET BY MOUTH DAILY 07/30/23   Signa Nest A, NP  cetirizine  (ZYRTEC  ALLERGY ) 10 MG tablet Take 1 tablet (10 mg total) by mouth daily. 06/05/22   Tobie Arleta SQUIBB, MD  fexofenadine  (ALLEGRA  ALLERGY ) 180 MG tablet Take 1 tablet (180 mg total) by mouth 2 (two) times daily as needed for allergies (hives). 07/31/22   Tobie Arleta SQUIBB, MD  levothyroxine  (SYNTHROID ) 100 MCG tablet Take 1 tablet (100 mcg total) by mouth daily. 01/26/22   Ameduite, Leonna S, FNP  pantoprazole (PROTONIX) 40 MG tablet Take 40 mg by mouth daily. 10/15/23 10/14/24  [provider]  SUMAtriptan  (IMITREX ) 50 MG tablet Take 1 tablet (50 mg total) by mouth every 2 (two) hours as needed for migraine. May  repeat in 2 hours if headache persists or recurs. Do not exceed 200mg  per day. 11/24/21   Ameduite, Leonna S, FNP    Current Outpatient Medications  Medication Sig Dispense Refill   amphetamine-dextroamphetamine (ADDERALL) 5 MG tablet Take 5 mg by mouth.     BLISOVI FE 1/20 1-20 MG-MCG tablet TAKE 1 TABLET BY MOUTH DAILY 84 tablet 3   escitalopram (LEXAPRO) 5 MG tablet Take 5 mg by mouth daily.     levothyroxine  (SYNTHROID ) 100 MCG tablet Take 1 tablet (100 mcg total) by mouth daily. 90 tablet 1   pantoprazole (PROTONIX) 40 MG tablet Take 40 mg by mouth daily.     amoxicillin (AMOXIL) 500 MG capsule Take 1,000 mg by mouth 2 (two) times daily. (Patient not taking: Reported on 01/23/2024)     cetirizine  (ZYRTEC  ALLERGY ) 10 MG tablet Take 1 tablet (10 mg total) by mouth daily. 30 tablet 5   fexofenadine  (ALLEGRA  ALLERGY ) 180 MG tablet Take 1 tablet (180 mg total) by mouth 2 (two) times daily as needed for allergies (hives). 60 tablet 5   SUMAtriptan  (IMITREX ) 50 MG tablet Take 1 tablet (50 mg total) by mouth every 2 (two) hours as needed for migraine. May repeat in 2 hours if headache persists or recurs. Do not exceed 200mg  per day. (Patient not taking: Reported on 01/23/2024) 10 tablet 0   Current Facility-Administered Medications  Medication Dose Route Frequency Provider Last Rate Last Admin   0.9 %  sodium chloride infusion  500  mL Intravenous Once Suzann Inocente HERO, MD        Allergies as of 01/23/2024   (No Known Allergies)    Family History  Problem Relation Age of Onset   Hypertension Paternal Grandfather    Mental illness Paternal Grandfather    Thyroid disease Paternal Grandmother    Cancer Paternal Grandmother    Diabetes Maternal Grandmother    Breast cancer Maternal Grandmother    Thyroid disease Maternal Grandmother    Diabetes Maternal Grandfather    Thyroid disease Mother    Thyroid disease Sister     Social History   Socioeconomic History   Marital status: Married     Spouse name: Not on file   Number of children: Not on file   Years of education: Not on file   Highest education level: Not on file  Occupational History   Not on file  Tobacco Use   Smoking status: Never    Passive exposure: Never   Smokeless tobacco: Never  Vaping Use   Vaping status: Never Used  Substance and Sexual Activity   Alcohol use: Yes    Comment: rarely   Drug use: Never   Sexual activity: Yes    Birth control/protection: Pill  Other Topics Concern   Not on file  Social History Narrative   Not on file   Social Drivers of Health   Financial Resource Strain: Low Risk  (10/14/2023)   Received from [facility]   Overall Financial Resource Strain (CARDIA)    Difficulty of Paying Living Expenses: Not hard at all  Food Insecurity: No Food Insecurity (10/14/2023)   Received from [facility]   Hunger Vital Sign    Within the past 12 months, you worried that your food would run out before you got the money to buy more.: Never true    Within the past 12 months, the food you bought just didn't last and you didn't have money to get more.: Never true  Transportation Needs: No Transportation Needs (10/14/2023)   Received from [facility] - Transportation    In the past 12 months, has lack of transportation kept you from medical appointments or from getting medications?: No    Lack of Transportation (Non-Medical): No  Physical Activity: Inactive (11/21/2022)   Exercise Vital Sign    Days of Exercise per Week: 0 days    Minutes of Exercise per Session: 0 min  Stress: No Stress Concern Present (11/21/2022)   [employer] of Occupational Health - Occupational Stress Questionnaire    Feeling of Stress : Not at all  Social Connections: Moderately Integrated (11/21/2022)   Social Connection and Isolation Panel    Frequency of Communication with Friends and Family: More than three times a week    Frequency of Social  Gatherings with Friends and Family: Twice a week    Attends Religious Services: More than 4 times per year    Active Member of [employer] or Organizations: No    Attends Banker Meetings: Never    Marital Status: Married  Catering manager Violence: Not At Risk (11/21/2022)   Humiliation, Afraid, Rape, and Kick questionnaire    Fear of Current or Ex-Partner: No    Emotionally Abused: No    Physically Abused: No    Sexually Abused: No    Review of Systems:  All other review of systems negative except as mentioned in the HPI.  Physical Exam: Vital signs BP  124/84   Pulse 98   Temp 98.1 F (36.7 C)   Resp 19   Ht 5' 5 (1.651 m)   Wt 190 lb (86.2 kg)   SpO2 100%   BMI 31.62 kg/m   General:   Alert,  Well-developed, well-nourished, pleasant and cooperative in NAD Airway:  Mallampati 2 Lungs:  Clear throughout to auscultation.   Heart:  Regular rate and rhythm; no murmurs, clicks, rubs,  or gallops. Abdomen:  Soft, nontender and nondistended. Normal bowel sounds.   Neuro/Psych:  Normal mood and affect. A and O x 3  Inocente Hausen, MD [facility]

## 2024-01-23 ENCOUNTER — Ambulatory Visit (AMBULATORY_SURGERY_CENTER): Admitting: Pediatrics

## 2024-01-23 ENCOUNTER — Encounter: Payer: Self-pay | Admitting: Pediatrics

## 2024-01-23 VITALS — BP 108/70 | HR 79 | Temp 98.1°F | Resp 16 | Ht 65.0 in | Wt 190.0 lb

## 2024-01-23 DIAGNOSIS — K200 Eosinophilic esophagitis: Secondary | ICD-10-CM | POA: Diagnosis not present

## 2024-01-23 DIAGNOSIS — R1110 Vomiting, unspecified: Secondary | ICD-10-CM

## 2024-01-23 DIAGNOSIS — R1013 Epigastric pain: Secondary | ICD-10-CM

## 2024-01-23 DIAGNOSIS — R1310 Dysphagia, unspecified: Secondary | ICD-10-CM | POA: Diagnosis not present

## 2024-01-23 DIAGNOSIS — K2970 Gastritis, unspecified, without bleeding: Secondary | ICD-10-CM

## 2024-01-23 DIAGNOSIS — K2950 Unspecified chronic gastritis without bleeding: Secondary | ICD-10-CM | POA: Diagnosis not present

## 2024-01-23 DIAGNOSIS — R1319 Other dysphagia: Secondary | ICD-10-CM

## 2024-01-23 MED ORDER — SODIUM CHLORIDE 0.9 % IV SOLN: 500.0000 mL | Freq: Once | INTRAVENOUS | Status: DC

## 2024-01-23 NOTE — Patient Instructions (Addendum)
-  await pathology results. -Continue present medications. - Return to GI clinic at appointment schedule   YOU HAD AN ENDOSCOPIC PROCEDURE TODAY AT THE  ENDOSCOPY CENTER:   Refer to the procedure report that was given to you for any specific questions about what was found during the examination.  If the procedure report does not answer your questions, please call your gastroenterologist to clarify.  If you requested that your care partner not be given the details of your procedure findings, then the procedure report has been included in a sealed envelope for you to review at your convenience later.  YOU SHOULD EXPECT: Some feelings of bloating in the abdomen. Passage of more gas than usual.  Walking can help get rid of the air that was put into your GI tract during the procedure and reduce the bloating. If you had a lower endoscopy (such as a colonoscopy or flexible sigmoidoscopy) you may notice spotting of blood in your stool or on the toilet paper. If you underwent a bowel prep for your procedure, you may not have a normal bowel movement for a few days.  Please Note:  You might notice some irritation and congestion in your nose or some drainage.  This is from the oxygen used during your procedure.  There is no need for concern and it should clear up in a day or so.  SYMPTOMS TO REPORT IMMEDIATELY:  Following upper endoscopy (EGD)  Vomiting of blood or coffee ground material  New chest pain or pain under the shoulder blades  Painful or persistently difficult swallowing  New shortness of breath  Fever of 100F or higher  Black, tarry-looking stools  For urgent or emergent issues, a gastroenterologist can be reached at any hour by calling [phone]. Do not use MyChart messaging for urgent concerns.    DIET:  We do recommend a small meal at first, but then you may proceed to your regular diet.  Drink plenty of fluids but you should avoid alcoholic beverages for 24 hours.  ACTIVITY:   You should plan to take it easy for the rest of today and you should NOT DRIVE or use heavy machinery until tomorrow (because of the sedation medicines used during the test).    FOLLOW UP: Our staff will call the number listed on your records the next business day following your procedure.  We will call around 7:15- 8:00 am to check on you and address any questions or concerns that you may have regarding the information given to you following your procedure. If we do not reach you, we will leave a message.     If any biopsies were taken you will be contacted by phone or by letter within the next 1-3 weeks.  Please call us  at [phone] if you have not heard about the biopsies in 3 weeks.    SIGNATURES/CONFIDENTIALITY: You and/or your care partner have signed paperwork which will be entered into your electronic medical record.  These signatures attest to the fact that that the information above on your After Visit Summary has been reviewed and is understood.  Full responsibility of the confidentiality of this discharge information lies with you and/or your care-partner.

## 2024-01-23 NOTE — Progress Notes (Signed)
 Pt's states no medical or surgical changes since previsit or office visit.

## 2024-01-23 NOTE — Op Note (Addendum)
 [facility] Patient Name: Charlene Hensley Procedure Date: 01/23/2024 1:29 PM MRN: [id number] Endoscopist: Inocente Hausen , MD, [id number] Age: 23 Referring MD:  Date of Birth: [birth year] Gender: Female Account #: [id number] Procedure:                Upper GI endoscopy Indications:              Epigastric abdominal pain, Dysphagia, Regurgitation Medicines:                Monitored Anesthesia Care Procedure:                Pre-Anesthesia Assessment:                           - Prior to the procedure, a History and Physical                            was performed, and patient medications and                            allergies were reviewed. The patient's tolerance of                            previous anesthesia was also reviewed. The risks                            and benefits of the procedure and the sedation                            options and risks were discussed with the patient.                            All questions were answered, and informed consent                            was obtained. Prior Anticoagulants: The patient has                            taken no anticoagulant or antiplatelet agents. ASA                            Grade Assessment: I - A normal, healthy patient.                            After reviewing the risks and benefits, the patient                            was deemed in satisfactory condition to undergo the                            procedure.                           After obtaining informed consent, the endoscope was  passed under direct vision. Throughout the                            procedure, the patient's blood pressure, pulse, and                            oxygen saturations were monitored continuously. The                            Olympus Scope [id number] was introduced through the                            mouth, and advanced to the second part of duodenum.                            The upper  GI endoscopy was accomplished without                            difficulty. The patient tolerated the procedure                            well. Scope In: Scope Out: Findings:                 No endoscopic abnormality was evident in the                            esophagus to explain the patient's complaint of                            dysphagia. It was decided, however, to proceed with                            dilation of the entire esophagus. A guidewire was                            placed and the scope was withdrawn. Dilation was                            performed with a Savary dilator with no resistance                            at 18 mm. Biopsies were obtained from the proximal                            and distal esophagus with cold forceps for                            histology of suspected eosinophilic esophagitis.                           The gastric body, gastric antrum, cardia (on  retroflexion) and gastric fundus (on retroflexion)                            were normal. Biopsies were taken with a cold                            forceps for Helicobacter pylori testing.                           The duodenal bulb and second portion of the                            duodenum were normal. Complications:            No immediate complications. Estimated blood loss:                            Minimal. Estimated Blood Loss:     Estimated blood loss was minimal. Impression:               - No endoscopic esophageal abnormality to explain                            patient's dysphagia. Esophagus dilated. Dilated.                           - Normal gastric body, antrum, cardia and gastric                            fundus. Biopsied.                           - Normal duodenal bulb and second portion of the                            duodenum.                           - Biopsies were taken with a cold forceps for                             evaluation of eosinophilic esophagitis. Recommendation:           - Discharge patient to home (ambulatory).                           - Await pathology results.                           - Return to Clinic in 6-8 weeks with Dr. Suzann or                            APP Oscar Coombs or [facility] May).                           - The findings and recommendations were discussed  with the patient's family.                           - Patient has a contact number available for                            emergencies. The signs and symptoms of potential                            delayed complications were discussed with the                            patient. Return to normal activities tomorrow.                            Written discharge instructions were provided to the                            patient. Inocente Hausen, MD 01/23/2024 1:51:49 PM This report has been signed electronically. Addendum Number: 1   Addendum Date: 01/23/2024 1:53:31 PM      Biopsies were performed with cold forceps of the duodenum for evaluation       of posisble celiac disease. Inocente Hausen, MD 01/23/2024 1:54:05 PM This report has been signed electronically.

## 2024-01-23 NOTE — Progress Notes (Signed)
 Drowsy, VSS, resps reg and even. Report to RN

## 2024-01-23 NOTE — Progress Notes (Signed)
 Called to room to assist during endoscopic procedure.  Patient ID and intended procedure confirmed with present staff. Received instructions for my participation in the procedure from the performing physician.

## 2024-01-24 ENCOUNTER — Telehealth: Payer: Self-pay

## 2024-01-24 ENCOUNTER — Telehealth: Payer: Self-pay | Admitting: Pediatrics

## 2024-01-24 NOTE — Telephone Encounter (Signed)
 I spoke on the cell phone with Charlene Hensley to follow-up symptoms of throat pain status post EGD yesterday.  She reported to procedure nurse 6 out of 10 discomfort when swallowing.  States that her throat discomfort is slightly improved this afternoon.  Only notes the discomfort when she is swallowing.  Feels like a catching sensation ; no shortness of breath, difficulty breathing, chest pain.  Advised the use of Chloraseptic spray and sore throat lozenges.  Counseled patient to call our office back if symptoms are not improving or worsening.

## 2024-01-24 NOTE — Telephone Encounter (Signed)
  Follow up Call-     01/23/2024   12:58 PM  Call back number  Post procedure Call Back phone  # [phone]  Permission to leave phone message Yes     Patient questions:  Do you have a fever, pain , or abdominal swelling? Yes.   Pain Score  6 *  Have you tolerated food without any problems? Yes.    Have you been able to return to your normal activities? Yes.    Do you have any questions about your discharge instructions: Diet   No. Medications  No. Follow up visit  No.  Do you have questions or concerns about your Care? No.  Actions: * If pain score is 4 or above: Physician/ provider Notified : Inocente Hausen, MD. Patient reports no difficulty swallowing, just pain. RN advised patient to use a throat lozenge or drink warm tea to soothe sore throat. Patient was also advised to call the urgent triage number provided should her pain worsen severely or she finds it difficult to swallow.

## 2024-01-29 LAB — SURGICAL PATHOLOGY

## 2024-01-30 ENCOUNTER — Ambulatory Visit: Payer: Self-pay | Admitting: Pediatrics

## 2024-02-13 ENCOUNTER — Institutional Professional Consult (permissible substitution) (INDEPENDENT_AMBULATORY_CARE_PROVIDER_SITE_OTHER): Admitting: Otolaryngology

## 2024-03-13 ENCOUNTER — Ambulatory Visit: Admitting: Physician Assistant

## 2024-03-25 ENCOUNTER — Ambulatory Visit: Admitting: Adult Health

## 2024-04-16 NOTE — Progress Notes (Unsigned)
 "    04/16/2024 Charlene Hensley [id number] Oct 15, 2000  Referring provider: Dow Longs, PA-C Primary GI doctor: Dr. Suzann  ASSESSMENT AND PLAN:  Dysphagia for years with regurgitation, epigastric pain, can be liquids and solids 01/22/2024 barium swallow tiny reducible hiatal hernia otherwise normal esophagram no stricture or reflux 01/23/2024 EGD noted scopic esophageal abnormality status post empiric dilatation normal gastric body duodenal bulb.  Pathology negative celiac, mild chronic gastritis negative H. pylori.  Patient had 15 acidophil's per high-power field which is most consistent with reflux esophagitis negative EOE - continue pantoprazole 40 mg daily -In the interim patient advised about swallowing precautions.  -Eat slowly, chew food well before swallowing.  -Drink liquids in between each bite to avoid food impaction. - ER precautions discussed with the patient   Patient Care Team: Dow Longs, PA-C as PCP - General (Family Medicine)  HISTORY OF PRESENT ILLNESS: 24 y.o. female with a past medical history listed below presents as a new patient for evaluation of dysphagia.   I last saw the patient in the office 01/15/2024 for dysphagia.  Discussed the use of AI scribe software for clinical note transcription with the patient, who gave verbal consent to proceed.  History of Present Illness         She  reports that she has never smoked. She has never been exposed to tobacco smoke. She has never used smokeless tobacco. She reports current alcohol use. She reports that she does not use drugs.  RELEVANT GI HISTORY, IMAGING AND LABS: Results           CBC    Component Value Date/Time   WBC 8.5 01/31/2022 2117   RBC 4.71 01/31/2022 2117   HGB 14.4 01/31/2022 2117   HGB 13.8 12/29/2021 0908   HCT 42.7 01/31/2022 2117   HCT 40.0 12/29/2021 0908   PLT 308 01/31/2022 2117   PLT 306 12/29/2021 0908   MCV 90.7 01/31/2022 2117   MCV 89 12/29/2021 0908   MCH 30.6  01/31/2022 2117   MCHC 33.7 01/31/2022 2117   RDW 11.4 (L) 01/31/2022 2117   RDW 11.6 (L) 12/29/2021 0908   LYMPHSABS 1.8 12/29/2021 0908   EOSABS 0.4 12/29/2021 0908   BASOSABS 0.1 12/29/2021 0908   No results for input(s): HGB in the last 8760 hours.  CMP     Component Value Date/Time   NA 140 01/31/2022 2117   NA 137 12/29/2021 0908   K 4.2 01/31/2022 2117   CL 107 01/31/2022 2117   CO2 25 01/31/2022 2117   GLUCOSE 110 (H) 01/31/2022 2117   BUN 6 01/31/2022 2117   BUN 5 (L) 12/29/2021 0908   CREATININE 0.72 01/31/2022 2117   CALCIUM 9.5 01/31/2022 2117   PROT 7.4 01/31/2022 2117   PROT 6.2 12/29/2021 0908   ALBUMIN 4.2 01/31/2022 2117   ALBUMIN 4.1 12/29/2021 0908   AST 17 01/31/2022 2117   ALT 14 01/31/2022 2117   ALKPHOS 54 01/31/2022 2117   BILITOT 0.5 01/31/2022 2117   BILITOT 0.4 12/29/2021 0908   GFRNONAA >60 01/31/2022 2117      Latest Ref Rng & Units 01/31/2022    9:17 PM 12/29/2021    9:08 AM  Hepatic Function  Total Protein 6.5 - 8.1 g/dL 7.4  6.2   Albumin 3.5 - 5.0 g/dL 4.2  4.1   AST 15 - 41 U/L 17  11   ALT 0 - 44 U/L 14  14   Alk Phosphatase 38 - 126 U/L 54  57   Total Bilirubin 0.3 - 1.2 mg/dL 0.5  0.4       Current Medications:   Current Outpatient Medications (Endocrine & Metabolic):    BLISOVI FE 1/20 8-79 MG-MCG tablet, TAKE 1 TABLET BY MOUTH DAILY   levothyroxine  (SYNTHROID ) 100 MCG tablet, Take 1 tablet (100 mcg total) by mouth daily.  Current Outpatient Medications (Respiratory):    cetirizine  (ZYRTEC  ALLERGY ) 10 MG tablet, Take 1 tablet (10 mg total) by mouth daily.   fexofenadine  (ALLEGRA  ALLERGY ) 180 MG tablet, Take 1 tablet (180 mg total) by mouth 2 (two) times daily as needed for allergies (hives).  Current Outpatient Medications (Analgesics):    SUMAtriptan  (IMITREX ) 50 MG tablet, Take 1 tablet (50 mg total) by mouth every 2 (two) hours as needed for migraine. May repeat in 2 hours if headache persists or recurs. Do not  exceed 200mg  per day. (Patient not taking: Reported on 01/23/2024)  Current Outpatient Medications (Other):    amoxicillin (AMOXIL) 500 MG capsule, Take 1,000 mg by mouth 2 (two) times daily. (Patient not taking: Reported on 01/23/2024)   amphetamine-dextroamphetamine (ADDERALL) 5 MG tablet, Take 5 mg by mouth.   escitalopram (LEXAPRO) 5 MG tablet, Take 5 mg by mouth daily.   pantoprazole (PROTONIX) 40 MG tablet, Take 40 mg by mouth daily.  Medical History:  Past Medical History:  Diagnosis Date   Acid reflux    Thyroid disease    Allergies: No Known Allergies   Surgical History:  She  has a past surgical history that includes Tonsillectomy (2003). Family History:  Her family history includes Breast cancer in her maternal grandmother; Cancer in her paternal grandmother; Diabetes in her maternal grandfather and maternal grandmother; Hypertension in her paternal grandfather; Mental illness in her paternal grandfather; Thyroid disease in her maternal grandmother, mother, paternal grandmother, and sister.  REVIEW OF SYSTEMS  : All other systems reviewed and negative except where noted in the History of Present Illness.  PHYSICAL EXAM: There were no vitals taken for this visit. Physical Exam          Alan JONELLE Coombs, PA-C 12:11 PM   "

## 2024-04-18 ENCOUNTER — Ambulatory Visit: Admitting: Physician Assistant

## 2024-05-28 ENCOUNTER — Ambulatory Visit: Admitting: Adult Health
